# Patient Record
Sex: Female | Born: 1970 | Race: White | Hispanic: No | Marital: Married | Smoking: Never smoker
Health system: Southern US, Community
[De-identification: ages and names within clinical notes are randomized; demographics above are authoritative.]

---

## 1999-07-31 ENCOUNTER — Other Ambulatory Visit: Admission: RE | Admit: 1999-07-31 | Discharge: 1999-07-31 | Payer: Self-pay | Admitting: Obstetrics and Gynecology

## 2000-09-09 ENCOUNTER — Other Ambulatory Visit: Admission: RE | Admit: 2000-09-09 | Discharge: 2000-09-09 | Payer: Self-pay | Admitting: Obstetrics and Gynecology

## 2001-08-06 ENCOUNTER — Other Ambulatory Visit: Admission: RE | Admit: 2001-08-06 | Discharge: 2001-08-06 | Payer: Self-pay | Admitting: Obstetrics and Gynecology

## 2001-09-23 ENCOUNTER — Inpatient Hospital Stay (HOSPITAL_COMMUNITY): Admission: AD | Admit: 2001-09-23 | Discharge: 2001-09-25 | Payer: Self-pay | Admitting: Obstetrics and Gynecology

## 2001-09-23 ENCOUNTER — Encounter: Admission: RE | Admit: 2001-09-23 | Discharge: 2001-12-22 | Payer: Self-pay | Admitting: Obstetrics and Gynecology

## 2002-02-26 ENCOUNTER — Encounter: Payer: Self-pay | Admitting: Obstetrics and Gynecology

## 2002-02-26 ENCOUNTER — Ambulatory Visit (HOSPITAL_COMMUNITY): Admission: RE | Admit: 2002-02-26 | Discharge: 2002-02-26 | Payer: Self-pay | Admitting: Obstetrics and Gynecology

## 2002-03-05 ENCOUNTER — Inpatient Hospital Stay (HOSPITAL_COMMUNITY): Admission: AD | Admit: 2002-03-05 | Discharge: 2002-03-14 | Payer: Self-pay | Admitting: Obstetrics and Gynecology

## 2002-03-06 ENCOUNTER — Encounter: Payer: Self-pay | Admitting: Obstetrics & Gynecology

## 2002-03-07 ENCOUNTER — Encounter: Payer: Self-pay | Admitting: *Deleted

## 2002-03-09 ENCOUNTER — Encounter: Payer: Self-pay | Admitting: Obstetrics & Gynecology

## 2002-03-11 ENCOUNTER — Encounter: Payer: Self-pay | Admitting: Obstetrics and Gynecology

## 2002-03-14 ENCOUNTER — Encounter: Payer: Self-pay | Admitting: Obstetrics and Gynecology

## 2002-04-05 ENCOUNTER — Inpatient Hospital Stay (HOSPITAL_COMMUNITY): Admission: AD | Admit: 2002-04-05 | Discharge: 2002-04-08 | Payer: Self-pay | Admitting: Obstetrics & Gynecology

## 2002-05-21 ENCOUNTER — Other Ambulatory Visit: Admission: RE | Admit: 2002-05-21 | Discharge: 2002-05-21 | Payer: Self-pay | Admitting: Obstetrics and Gynecology

## 2003-08-08 ENCOUNTER — Other Ambulatory Visit: Admission: RE | Admit: 2003-08-08 | Discharge: 2003-08-08 | Payer: Self-pay | Admitting: Obstetrics and Gynecology

## 2004-03-01 ENCOUNTER — Inpatient Hospital Stay (HOSPITAL_COMMUNITY): Admission: AD | Admit: 2004-03-01 | Discharge: 2004-03-01 | Payer: Self-pay | Admitting: *Deleted

## 2004-03-10 ENCOUNTER — Inpatient Hospital Stay (HOSPITAL_COMMUNITY): Admission: AD | Admit: 2004-03-10 | Discharge: 2004-03-10 | Payer: Self-pay | Admitting: Obstetrics and Gynecology

## 2004-03-13 ENCOUNTER — Ambulatory Visit (HOSPITAL_COMMUNITY): Admission: RE | Admit: 2004-03-13 | Discharge: 2004-03-13 | Payer: Self-pay | Admitting: Obstetrics and Gynecology

## 2004-07-26 ENCOUNTER — Ambulatory Visit (HOSPITAL_COMMUNITY): Admission: RE | Admit: 2004-07-26 | Discharge: 2004-07-26 | Payer: Self-pay | Admitting: Obstetrics and Gynecology

## 2004-12-12 IMAGING — US US OB COMP LESS 14 WK
1 series · 8 of 8 positions shown · non-contrast
Comparison: none

CLINICAL DATA: Possible ectopic pregnancy.  Non-rising quantitative beta hCG with the highest beta hCG recorded so far of 300.
EARLY OBSTETRICAL ULTRASOUND WITH TRANSVAGINAL, 03/01/04
Multiple images of the uterus and adnexa were obtained using a transabdominal and endovaginal approaches.  
There is an irregular intrauterine fluid collection identified which when measured would correlate with a 5 week 0 day gestation.  There does appear to be a decidual reaction around this.  No evidence for yolk sac or fetal pole could be seen but would not be expected if this were a gestational sac as the mean sac diameter would be 10 mm.  Some questionable hydropic change is evident within the suspected chorionic reaction.  
Both ovaries are seen and have a normal appearance, with the right ovary measuring 3.0 x 1.6 x 1.5 cm and the left ovary measuring 2.3 x. 3.1 x 1.7 cm.  No sonographic signs of an ectopic pregnancy are seen but may not be visualized with a quantitative beta hCG of 300.  The intrauterine findings could represent an irregular gestational sac with hydropic changes representing stigmata of a failed intrauterine pregnancy.  However, a pseudo gestational sac cannot be excluded with this appearance.
IMPRESSION 
Intrauterine fluid collection felt most likely to represent an irregular gestational sac with associated early hydropic changes and adjacent decidual reaction.  This suggests a failed gestation  in light of the patient?s history of a non-rising quantitative beta hCG.  A pseudo gestational sac with sonographically silent ectopic pregnancy would be a secondary consideration.
Normal ovaries.  No evidence for free intraperitoneal fluid.

[Series 1: us ob comp less 14 wk · 8 of 8 slices shown]
[im 1/8]
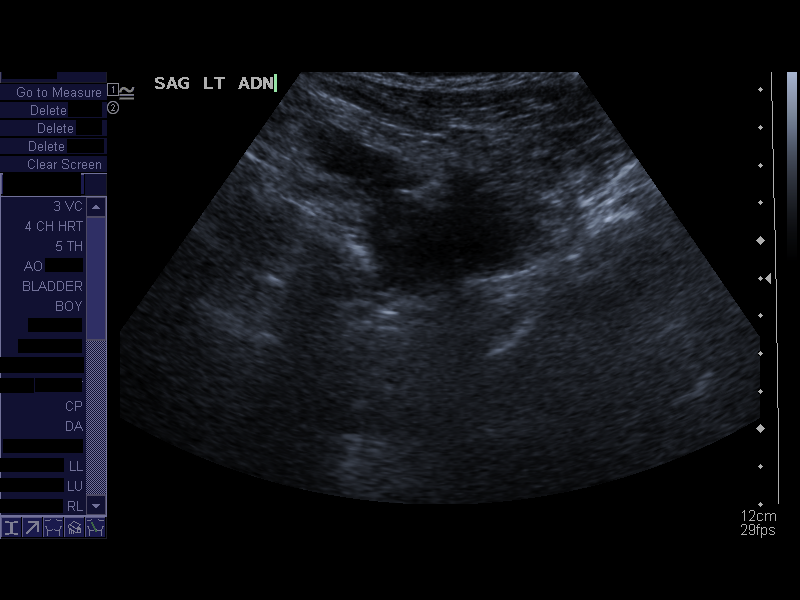
[im 2/8]
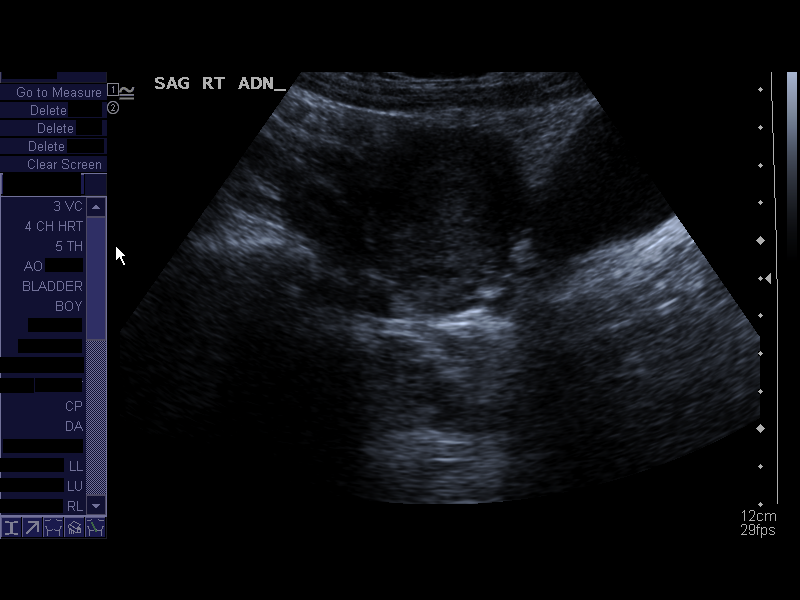
[im 3/8]
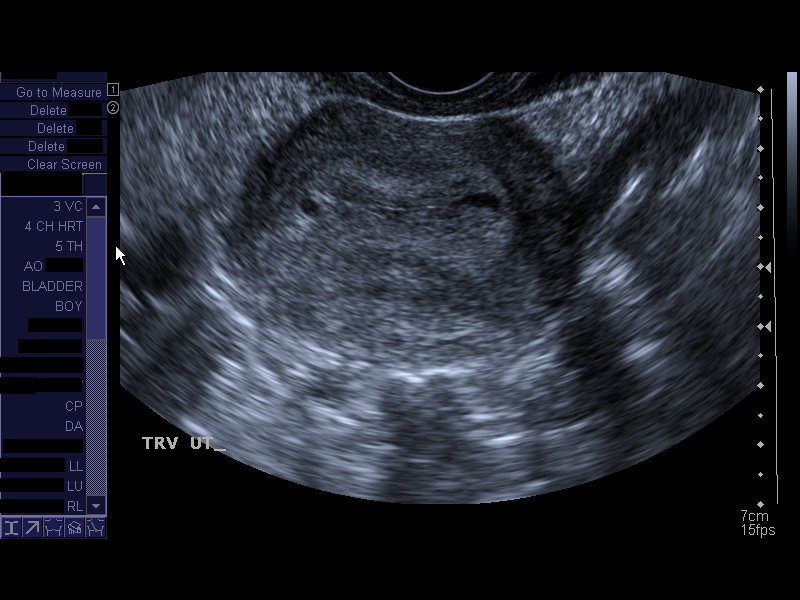
[im 4/8]
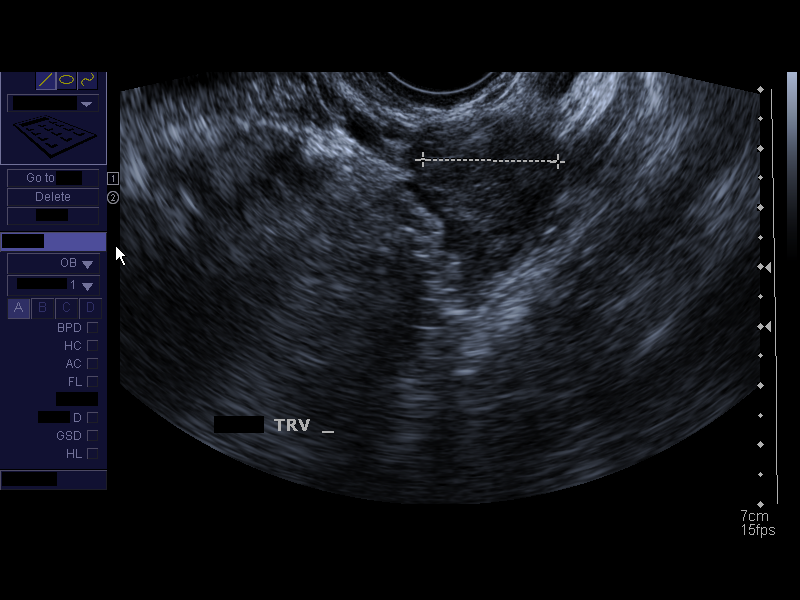
[im 5/8]
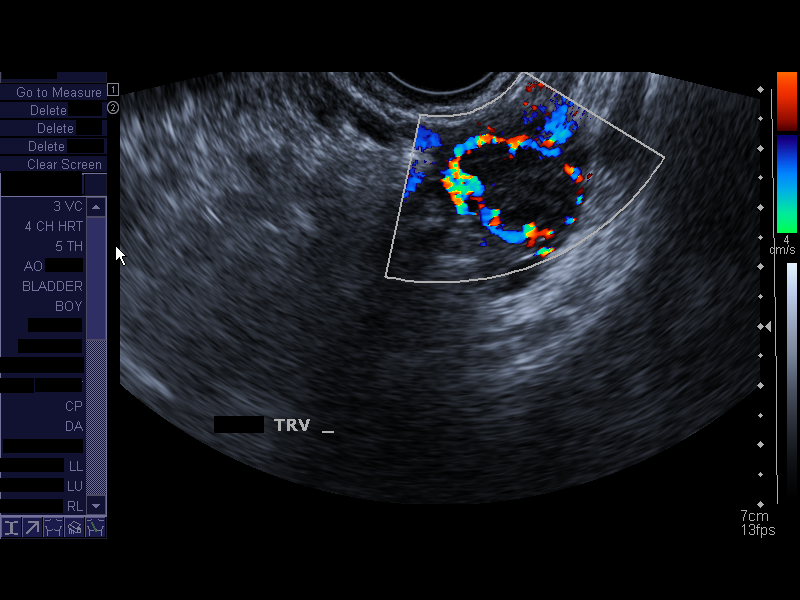
[im 6/8]
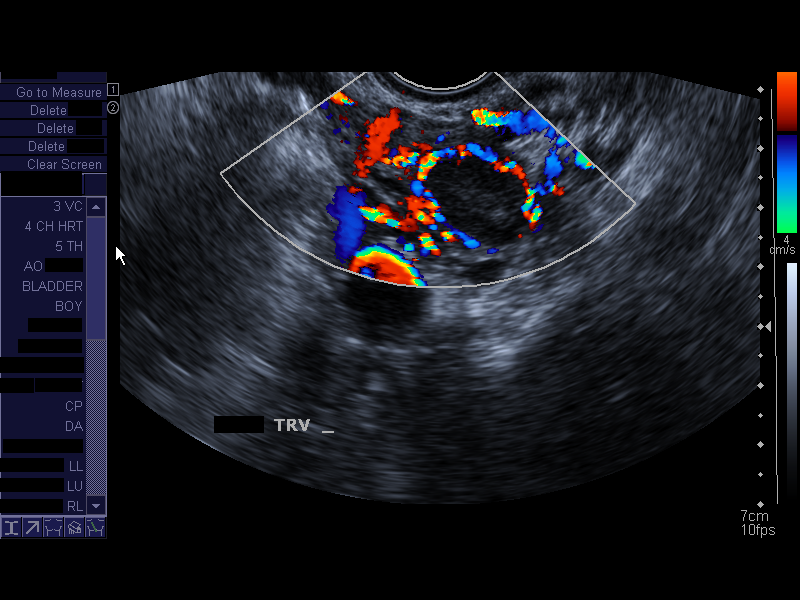
[im 7/8]
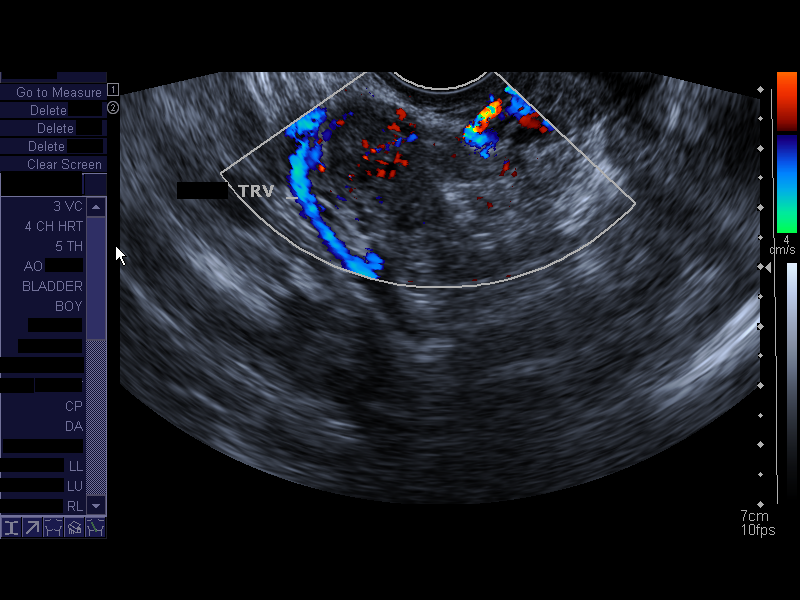
[im 8/8]
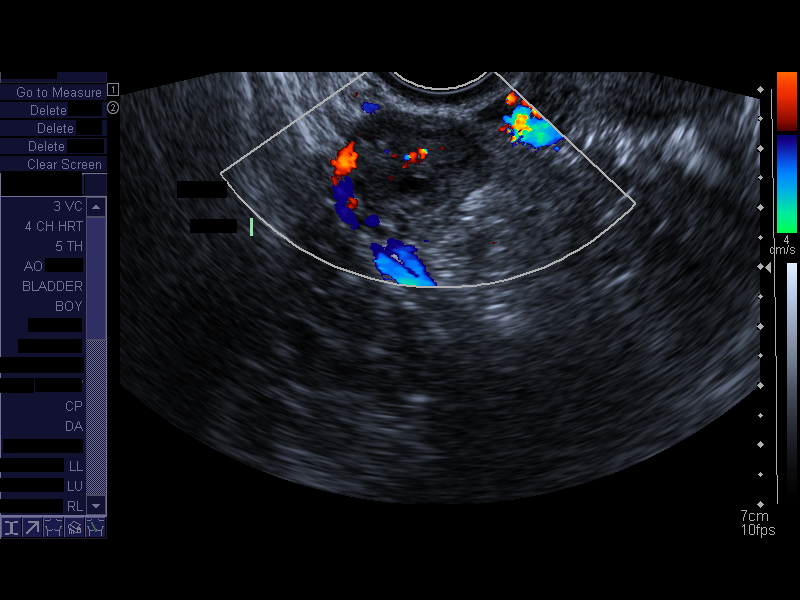

[8 of 8 positions shown; findings below may reference images not displayed]

## 2005-03-11 ENCOUNTER — Inpatient Hospital Stay (HOSPITAL_COMMUNITY): Admission: RE | Admit: 2005-03-11 | Discharge: 2005-03-11 | Payer: Self-pay | Admitting: Obstetrics and Gynecology

## 2005-03-12 ENCOUNTER — Inpatient Hospital Stay (HOSPITAL_COMMUNITY): Admission: AD | Admit: 2005-03-12 | Discharge: 2005-03-14 | Payer: Self-pay | Admitting: Obstetrics and Gynecology

## 2005-05-08 IMAGING — US US OB TRANSVAGINAL MODIFY
1 series · 13 of 28 positions shown · non-contrast
Comparison: none

CLINICAL DATA: Assess for viability.  
EARLY OBSTETRICAL ULTRASOUND WITH TRANSVAGINAL:
Multiple images of the uterus and adnexa were obtained using a transabdominal and endovaginal approaches. 
There is an intrauterine gestational sac identified which has sac size correlating with a 6 week 3 day gestation.  A mean sac diameter of 16 mm is noted.  No definite fetal pole or cardiac flicker is identified on today?s exam, but would not necessarily be expected at today?s mean sac diameter.  Follow-up evaluation in greater than 4 days can be undertaken for further assessment as we would expect to see a fetal pole by the time the gestational sac mean diameter reaches 18 mm.  No evidence for subchorionic hemorrhage is seen.
Both ovaries are visualized with the right ovary measuring 2.4 x 1.3 x 2.0 cm and the left ovary measuring 2.9 x 1.9 x 2.0 cm and containing a corpus luteum cyst.  No cul-de-sac or periovarian fluid is seen and no separate adnexal masses are noted.

[Series 1: us ob transvaginal modify · 0.29mm/px · 13 of 37 slices shown]
[im 2/37]
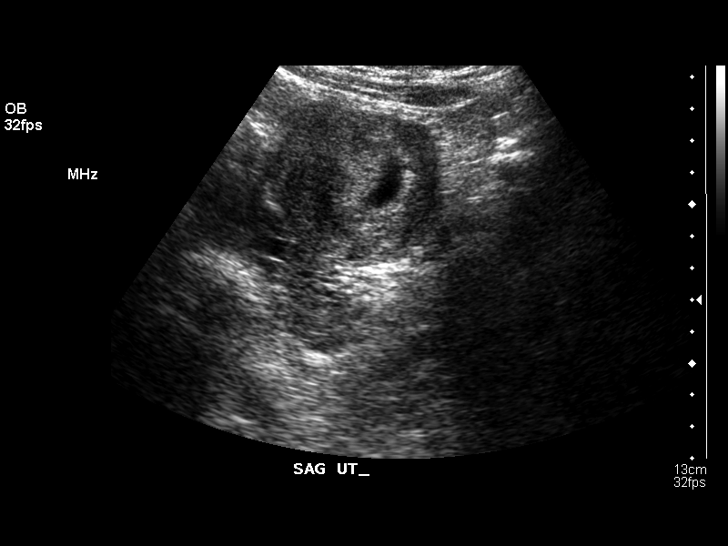
[im 5/37]
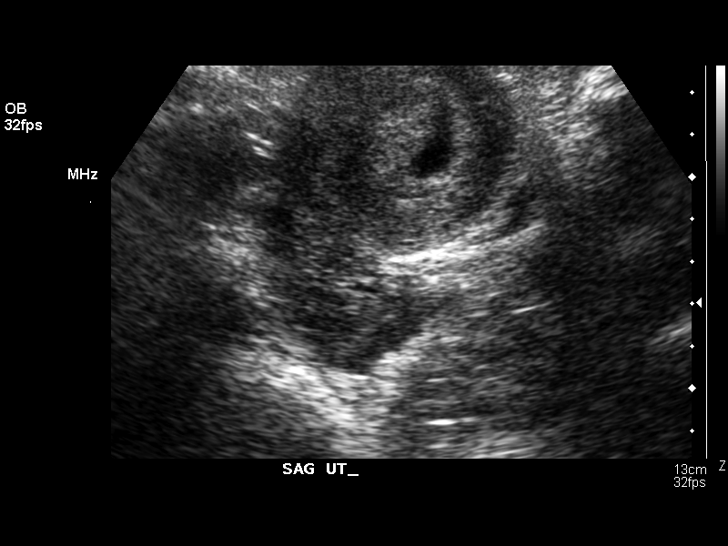
[im 7/37]
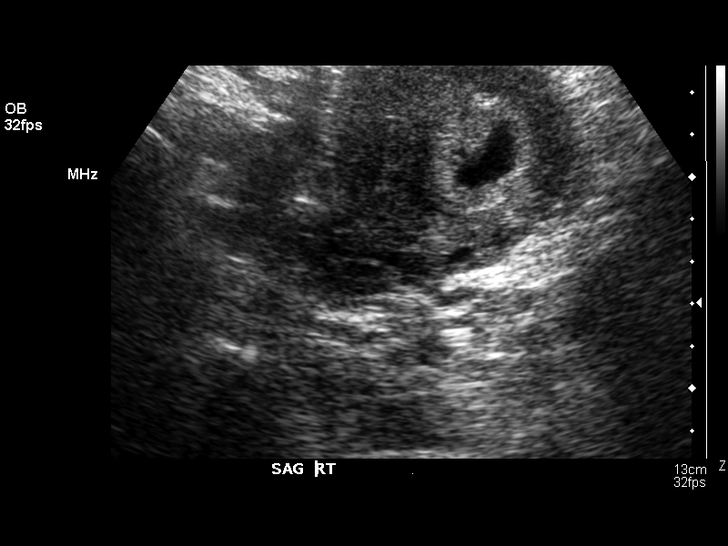
[im 10/37]
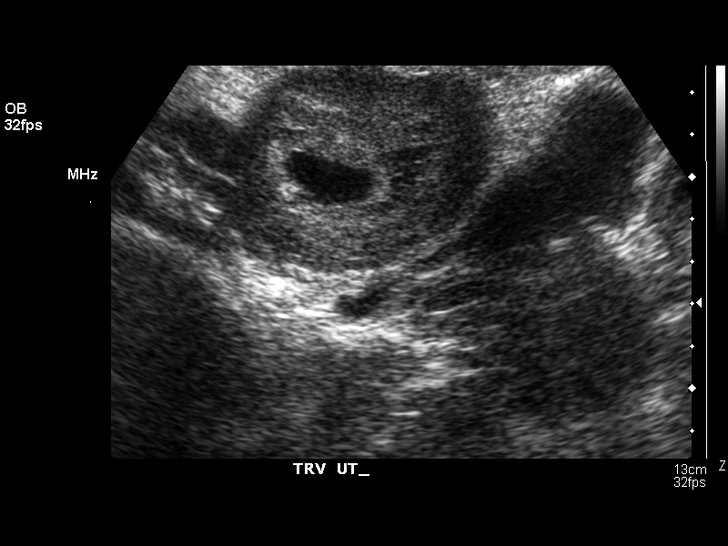
[im 13/37]
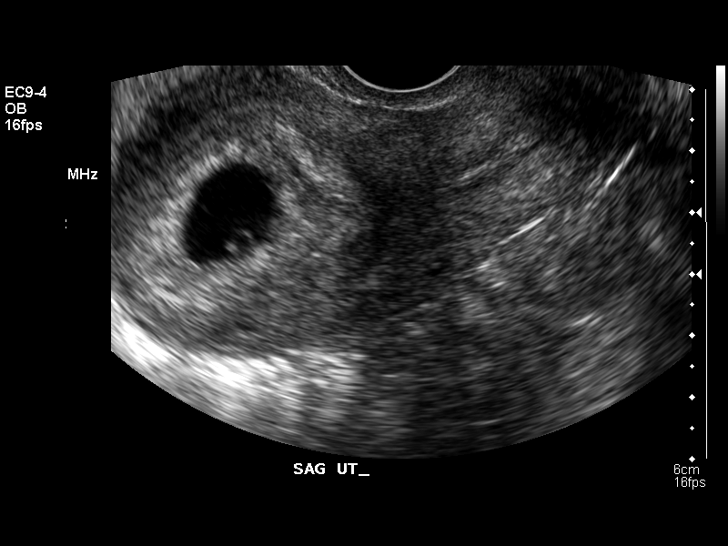
[im 15/37]
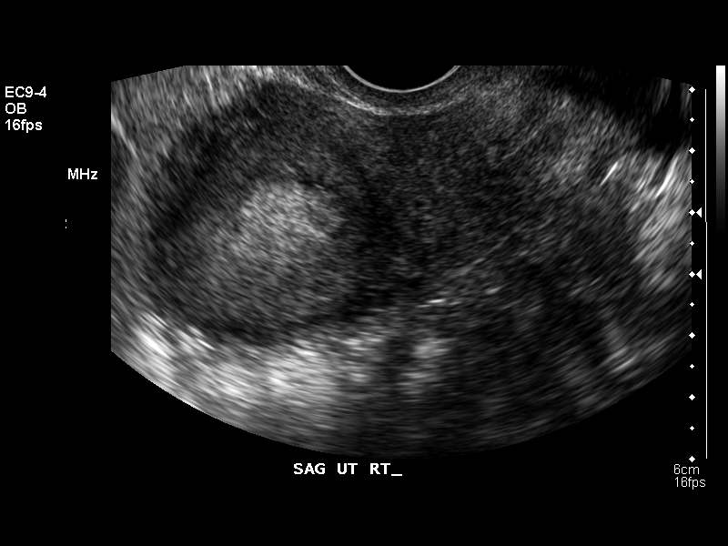
[im 19/37]
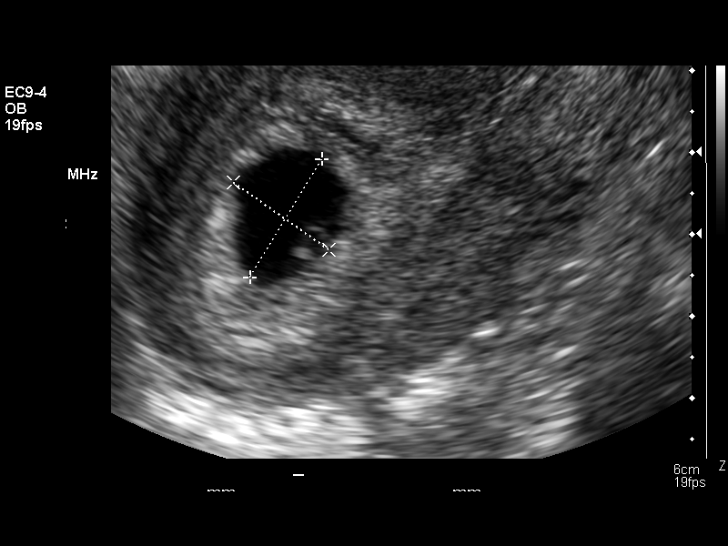
[im 22/37]
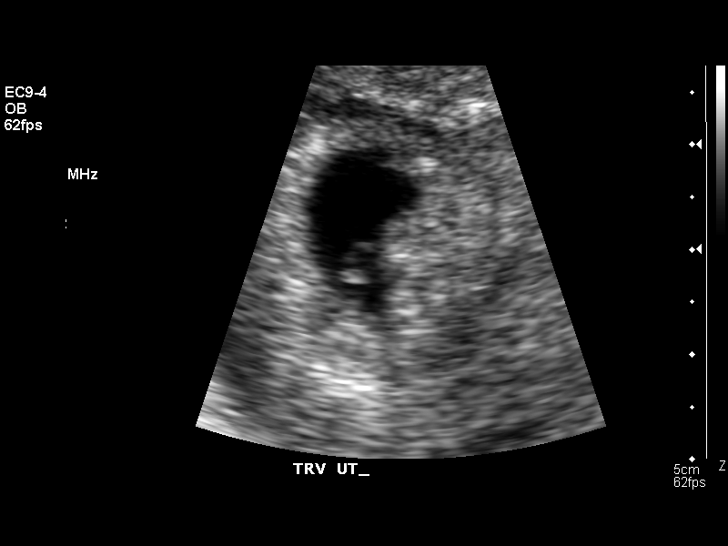
[im 25/37]
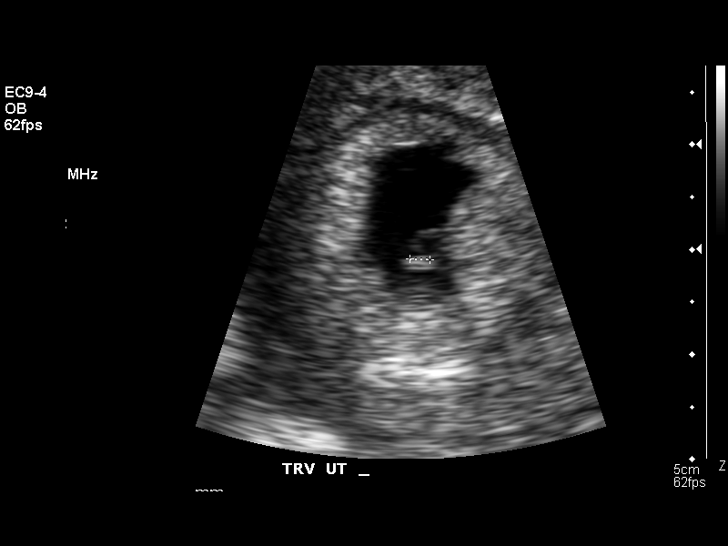
[im 27/37]
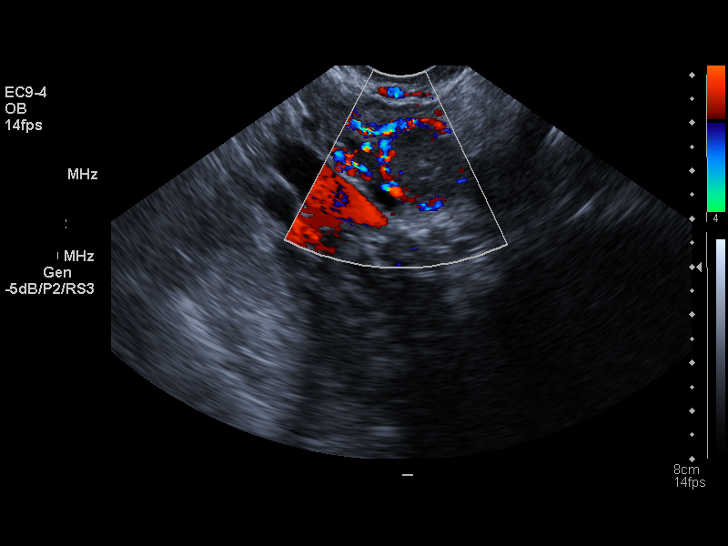
[im 30/37]
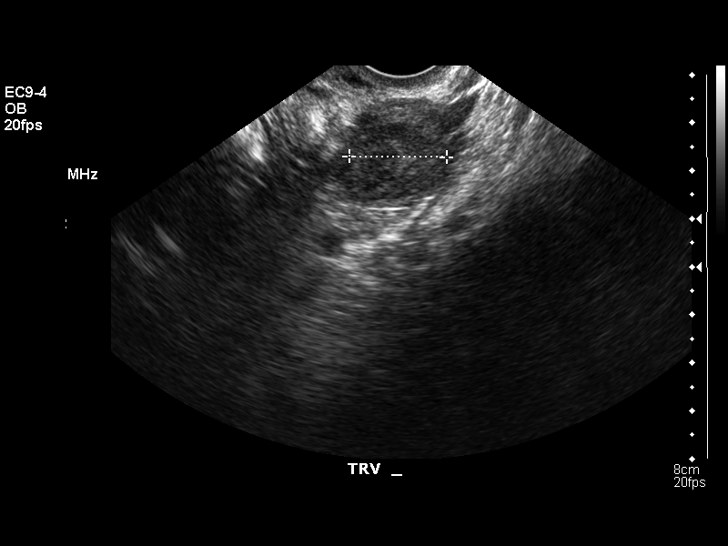
[im 33/37]
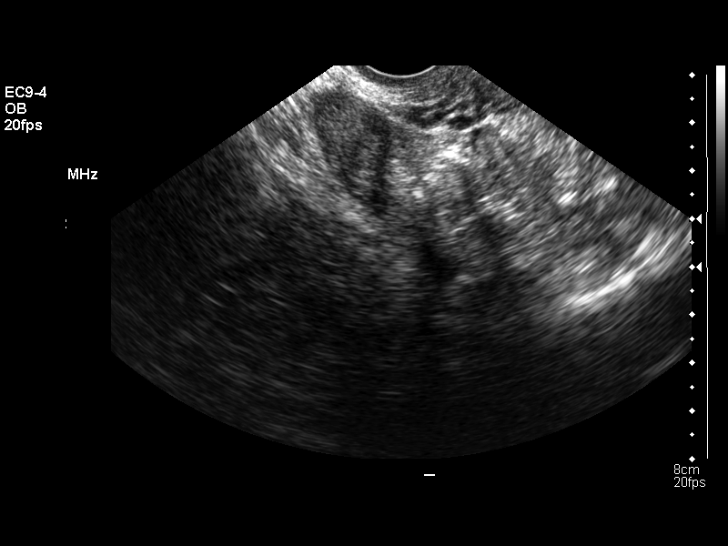
[im 35/37]
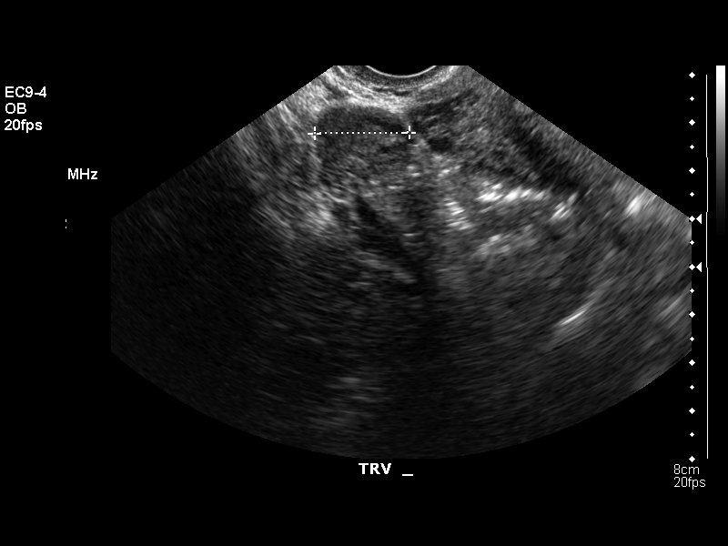

[13 of 28 positions shown; findings below may reference images not displayed]

IMPRESSION: 6 week 3 day intrauterine gestational sac with yolk sac.   Follow-up evaluation in one week can be undertaken to assess for progression of gestation to visualization of fetal pole and fetal cardiac activity.  Normal ovaries.

## 2010-08-19 ENCOUNTER — Encounter: Payer: Self-pay | Admitting: Obstetrics and Gynecology

## 2012-06-08 ENCOUNTER — Other Ambulatory Visit: Payer: Self-pay | Admitting: Obstetrics and Gynecology

## 2012-06-08 DIAGNOSIS — Z1231 Encounter for screening mammogram for malignant neoplasm of breast: Secondary | ICD-10-CM

## 2012-07-31 ENCOUNTER — Ambulatory Visit
Admission: RE | Admit: 2012-07-31 | Discharge: 2012-07-31 | Disposition: A | Discharge status: Home / Self Care | Payer: BC Managed Care – PPO | Source: Ambulatory Visit | Attending: Obstetrics and Gynecology | Admitting: Obstetrics and Gynecology

## 2012-07-31 DIAGNOSIS — Z1231 Encounter for screening mammogram for malignant neoplasm of breast: Secondary | ICD-10-CM

## 2013-01-27 ENCOUNTER — Other Ambulatory Visit: Payer: Self-pay | Admitting: *Deleted

## 2013-01-27 MED ORDER — GLUCOSE BLOOD VI STRP: ORAL_STRIP | Status: DC

## 2013-02-18 ENCOUNTER — Other Ambulatory Visit: Payer: Self-pay | Admitting: *Deleted

## 2013-02-18 MED ORDER — INSULIN ASPART 100 UNIT/ML ~~LOC~~ SOLN: SUBCUTANEOUS | Status: DC

## 2013-03-24 ENCOUNTER — Other Ambulatory Visit: Payer: Self-pay | Admitting: *Deleted

## 2013-03-27 ENCOUNTER — Other Ambulatory Visit: Payer: Self-pay | Admitting: Endocrinology

## 2013-04-13 ENCOUNTER — Other Ambulatory Visit: Payer: Self-pay | Admitting: *Deleted

## 2013-04-13 ENCOUNTER — Other Ambulatory Visit: Payer: Self-pay | Admitting: Endocrinology

## 2013-04-13 DIAGNOSIS — E119 Type 2 diabetes mellitus without complications: Secondary | ICD-10-CM

## 2013-04-13 DIAGNOSIS — E785 Hyperlipidemia, unspecified: Secondary | ICD-10-CM | POA: Insufficient documentation

## 2013-04-13 DIAGNOSIS — E1065 Type 1 diabetes mellitus with hyperglycemia: Secondary | ICD-10-CM | POA: Insufficient documentation

## 2013-04-22 ENCOUNTER — Ambulatory Visit (INDEPENDENT_AMBULATORY_CARE_PROVIDER_SITE_OTHER): Payer: BC Managed Care – PPO | Admitting: Endocrinology

## 2013-04-22 ENCOUNTER — Other Ambulatory Visit (INDEPENDENT_AMBULATORY_CARE_PROVIDER_SITE_OTHER): Payer: BC Managed Care – PPO

## 2013-04-22 ENCOUNTER — Encounter: Payer: Self-pay | Admitting: Endocrinology

## 2013-04-22 VITALS — BP 120/82 | HR 79 | Temp 98.3°F | Resp 12 | Ht 63.0 in | Wt 178.1 lb

## 2013-04-22 DIAGNOSIS — E7800 Pure hypercholesterolemia, unspecified: Secondary | ICD-10-CM

## 2013-04-22 DIAGNOSIS — E119 Type 2 diabetes mellitus without complications: Secondary | ICD-10-CM

## 2013-04-22 DIAGNOSIS — E785 Hyperlipidemia, unspecified: Secondary | ICD-10-CM

## 2013-04-22 DIAGNOSIS — E1065 Type 1 diabetes mellitus with hyperglycemia: Secondary | ICD-10-CM

## 2013-04-22 LAB — URINALYSIS, ROUTINE W REFLEX MICROSCOPIC
Bilirubin Urine: NEGATIVE
Ketones, ur: NEGATIVE
Nitrite: NEGATIVE
Specific Gravity, Urine: 1.02 (ref 1.000–1.030)
Total Protein, Urine: NEGATIVE
Urobilinogen, UA: 0.2 (ref 0.0–1.0)
pH: 5.5 (ref 5.0–8.0)

## 2013-04-22 LAB — LIPID PANEL
Cholesterol: 230 mg/dL — ABNORMAL HIGH (ref 0–200)
HDL: 66.9 mg/dL (ref >39.00)
Triglycerides: 36 mg/dL (ref 0.0–149.0)
VLDL: 7.2 mg/dL (ref 0.0–40.0)

## 2013-04-22 LAB — COMPREHENSIVE METABOLIC PANEL
AST: 12 U/L (ref 0–37)
Albumin: 4 g/dL (ref 3.5–5.2)
Alkaline Phosphatase: 47 U/L (ref 39–117)
BUN: 16 mg/dL (ref 6–23)
CO2: 27 mEq/L (ref 19–32)
Calcium: 8.9 mg/dL (ref 8.4–10.5)
Chloride: 106 mEq/L (ref 96–112)
GFR: 78.98 mL/min (ref >60.00)
Glucose, Bld: 151 mg/dL — ABNORMAL HIGH (ref 70–99)
Potassium: 4.3 mEq/L (ref 3.5–5.1)
Total Protein: 6.7 g/dL (ref 6.0–8.3)

## 2013-04-22 LAB — MICROALBUMIN / CREATININE URINE RATIO: Creatinine,U: 79.8 mg/dL

## 2013-04-22 LAB — HEMOGLOBIN A1C: Hgb A1c MFr Bld: 5.5 % (ref 4.6–6.5)

## 2013-04-22 NOTE — Progress Notes (Signed)
Patient ID: UNNAMED Tammy Vega, female   DOB: Nov 28, 1970, 42 y.o.   MRN: [id number]  Tammy Vega is an 42 y.o. female.   Reason for Appointment: Insulin Pump followup:   History of Present Illness   Diagnosis: Type 1 DIABETES MELITUS, date of diagnosis:2003     DIABETES history: She was initially diagnosed with diabetes during pregnancy and required insulin. She went on insulin again in 2004 and was started on the pump in 2005. Previously has had somewhat very well controlled with needing periodic adjustments and A1c around 6-7  CURRENT insulin pump:  Medtronic  HISTORY: She has not been seen since 1/14. Her blood sugars are overall high with average of 181. Most of her high readings are on waking up and late in the evening and she has a tendency to hypoglycemia before supper. Not taking many readings during the day Diet: Usually eating a protein bar at breakfast and is arbitrarily bolusing 2-3 units for that. Blood sugars are variable at lunch and training to be higher after supper. Some of these high readings may be related to missing boluses at suppertime but also having tend to higher readings if eating late evening meal She had previously been on a higher basal rate for her premenstrual week but is not using this now  The pump SETTINGS are: Basal rate: 0.525 at midnight, 3 AM = 0.65., 5 AM = 1.40, 7:30 a.m. = 1.0,. 11 AM = 1.40, 4 PM = 0.80, 7 PM = 1.20 and 0.8 at 11 p.m. Carbohydrate ratio 1:15 at breakfast, 1:12  other meals . Correction factor 1: 40, target 90-120  GLUCOSE CONTROL with the pump is assessed today by pump download.  Fasting readings recently 155-256. Midday 96-257 and around 5-7 PM 48-266, late evening/at bedtime 93-360, average reading there are bedtime 243 and in the morning 195  HYPOGLYCEMIC episodes are mostly between 5-7 PM, occasionally after a bolus, rarely at midnight or 10 AM  Lab Results  Component Value Date   HGBA1C 5.5 04/22/2013    EXERCISE:  Not consistent  MICROALBUMIN has been tested, and the result is normal .  Appointment on 04/22/2013  Component Date Value Range Status  . Hemoglobin A1C 04/22/2013 5.5  4.6 - 6.5 % Final   Glycemic Control Guidelines for People with Diabetes:Non Diabetic:  <6%Goal of Therapy: <7%Additional Action Suggested:  >8%   . Sodium 04/22/2013 138  135 - 145 mEq/L Final  . Potassium 04/22/2013 4.3  3.5 - 5.1 mEq/L Final  . Chloride 04/22/2013 106  96 - 112 mEq/L Final  . CO2 04/22/2013 27  19 - 32 mEq/L Final  . Glucose, Bld 04/22/2013 151* 70 - 99 mg/dL Final  . BUN 78/29/5621 16  6 - 23 mg/dL Final  . Creatinine, Ser 04/22/2013 0.8  0.4 - 1.2 mg/dL Final  . Total Bilirubin 04/22/2013 0.8  0.3 - 1.2 mg/dL Final  . Alkaline Phosphatase 04/22/2013 47  39 - 117 U/L Final  . AST 04/22/2013 12  0 - 37 U/L Final  . ALT 04/22/2013 10  0 - 35 U/L Final  . Total Protein 04/22/2013 6.7  6.0 - 8.3 g/dL Final  . Albumin 30/86/5784 4.0  3.5 - 5.2 g/dL Final  . Calcium 69/62/9528 8.9  8.4 - 10.5 mg/dL Final  . GFR 41/32/4401 78.98  >60.00 mL/min Final  . Microalb, Ur 04/22/2013 1.4  0.0 - 1.9 mg/dL Final  . Creatinine,U 02/72/5366 79.8   Final  . Microalb Creat Ratio  04/22/2013 1.8  0.0 - 30.0 mg/g Final  . Cholesterol 04/22/2013 230* 0 - 200 mg/dL Final   ATP III Classification       Desirable:  < 200 mg/dL               Borderline High:  200 - 239 mg/dL          High:  > = 161 mg/dL  . Triglycerides 04/22/2013 36.0  0.0 - 149.0 mg/dL Final   Normal:  <096 mg/dLBorderline High:  150 - 199 mg/dL  . HDL 04/22/2013 66.90  >39.00 mg/dL Final  . VLDL 04/54/0981 7.2  0.0 - 19.1 mg/dL Final  . Total CHOL/HDL Ratio 04/22/2013 3   Final                  Men          Women1/2 Average Risk     3.4          3.3Average Risk          5.0          4.42X Average Risk          9.6          7.13X Average Risk          15.0          11.0                      . Color, Urine 04/22/2013 LT. YELLOW  Yellow;Lt. Yellow Final  .  APPearance 04/22/2013 CLEAR  Clear Final  . Specific Gravity, Urine 04/22/2013 1.020  1.000-1.030 Final  . pH 04/22/2013 5.5  5.0 - 8.0 Final  . Total Protein, Urine 04/22/2013 NEGATIVE  Negative Final  . Urine Glucose 04/22/2013 NEGATIVE  Negative Final  . Ketones, ur 04/22/2013 NEGATIVE  Negative Final  . Bilirubin Urine 04/22/2013 NEGATIVE  Negative Final  . Hgb urine dipstick 04/22/2013 SMALL  Negative Final  . Urobilinogen, UA 04/22/2013 0.2  0.0 - 1.0 Final  . Leukocytes, UA 04/22/2013 NEGATIVE  Negative Final  . Nitrite 04/22/2013 NEGATIVE  Negative Final  . WBC, UA 04/22/2013 0-2/hpf  0-2/hpf Final  . RBC / HPF 04/22/2013 0-2/hpf  0-2/hpf Final  . Squamous Epithelial / LPF 04/22/2013 Rare(0-4/hpf)  Rare(0-4/hpf) Final  . Direct LDL 04/22/2013 150.5   Final   Optimal:  <100 mg/dLNear or Above Optimal:  100-129 mg/dLBorderline High:  130-159 mg/dLHigh:  160-189 mg/dLVery High:  >190 mg/dL      Medication List       This list is accurate as of: 04/22/13  4:08 PM.  Always use your most recent med list.               glucose blood test strip  Commonly known as:  BAYER CONTOUR NEXT TEST  Test blood sugars 8 times daily.     ibuprofen 200 MG tablet  Commonly known as:  ADVIL,MOTRIN  Take 200 mg by mouth every 6 (six) hours as needed for pain (PRN).     levonorgestrel 20 MCG/24HR IUD  Commonly known as:  MIRENA  1 each by Intrauterine route once.     NOVOLOG 100 UNIT/ML injection  Generic drug:  insulin aspart  use max of 60 units per day with pump as directed.     simvastatin 40 MG tablet  Commonly known as:  ZOCOR  40 mg.        Allergies: No Known Allergies  No past medical history on file.  No past surgical history on file.  No family history on file.  Social History:  reports that she has never smoked. She has never used smokeless tobacco. Her alcohol and drug histories are not on file.  REVIEW of systems:  She has had hypercholesterolemia and has  been on Zocor for a few years. LDL needs to be assessed  EXAM:  BP 120/82  Pulse 79  Temp(Src) 98.3 F (36.8 C)  Resp 12  Ht 5\' 3"  (1.6 m)  Wt 178 lb 1.6 oz (80.786 kg)  BMI 31.56 kg/m2  SpO2 96%  ASSESSMENT:  Diabetes with fair control, recently appears to be having significant hyperglycemia especially on waking up and late evening  Problems identified/plan:   Needing higher basal rate on waking up and probably late in the evening Needs to check more readings at lunchtime Also needs to periodically monitor postprandial readings to help adjust carbohydrate ratio Reduced basal rate needed at suppertime to prevent hyperglycemia She will try to enter carbohydrates for her meals more consistently as her boluses only 37% with food Avoid overriding boluses which he is doing 60% of the time  New basal rates:Basal rate: 0.525 at midnight, 3 AM = 0.65., 5 AM = 1.50, 7:30 a.m. = 1.0,. 11 AM = 1.40, 4 PM = 0.70, 8 PM = 1.3 and 0.8 at 11 p.m.   Hypercholesterolemia: Labs to be checked  Counseling time over 50% of today's 25 minute visit  Makaylie Dedeaux 04/22/2013, 4:08 PM

## 2013-04-22 NOTE — Patient Instructions (Addendum)
NEW BASAL  CHECK 2 HR AFTER MEALS

## 2013-04-23 ENCOUNTER — Ambulatory Visit: Payer: BC Managed Care – PPO

## 2013-04-23 ENCOUNTER — Telehealth: Payer: Self-pay | Admitting: *Deleted

## 2013-04-23 DIAGNOSIS — IMO0002 Reserved for concepts with insufficient information to code with codable children: Secondary | ICD-10-CM

## 2013-04-23 DIAGNOSIS — R5381 Other malaise: Secondary | ICD-10-CM

## 2013-04-23 DIAGNOSIS — R5383 Other fatigue: Secondary | ICD-10-CM

## 2013-04-23 DIAGNOSIS — E1065 Type 1 diabetes mellitus with hyperglycemia: Secondary | ICD-10-CM

## 2013-04-23 LAB — FRUCTOSAMINE: Fructosamine: 315 umol/L — ABNORMAL HIGH (ref <285)

## 2013-04-23 LAB — CBC WITH DIFFERENTIAL/PLATELET
Basophils Absolute: 0 10*3/uL (ref 0.0–0.1)
Basophils Relative: 0.4 % (ref 0.0–3.0)
Eosinophils Absolute: 0.1 10*3/uL (ref 0.0–0.7)
Eosinophils Relative: 1.8 % (ref 0.0–5.0)
HCT: 43.5 % (ref 36.0–46.0)
Hemoglobin: 14.6 g/dL (ref 12.0–15.0)
Lymphocytes Relative: 29.8 % (ref 12.0–46.0)
Lymphs Abs: 1.6 10*3/uL (ref 0.7–4.0)
MCHC: 33.5 g/dL (ref 30.0–36.0)
MCV: 90.2 fl (ref 78.0–100.0)
Monocytes Absolute: 0.5 10*3/uL (ref 0.1–1.0)
Monocytes Relative: 9.1 % (ref 3.0–12.0)
Neutro Abs: 3.2 10*3/uL (ref 1.4–7.7)
Neutrophils Relative %: 58.9 % (ref 43.0–77.0)
Platelets: 308 10*3/uL (ref 150.0–400.0)
RBC: 4.83 Mil/uL (ref 3.87–5.11)
RDW: 13.5 % (ref 11.5–14.6)
WBC: 5.4 10*3/uL (ref 4.5–10.5)

## 2013-04-23 NOTE — Telephone Encounter (Signed)
Message copied by Hermenia Bers on Fri Apr 23, 2013 11:59 AM ------      Message from: Reather Littler      Created: Fri Apr 23, 2013 10:40 AM       A1c is 5.5, not expected, test was repeated. Also checking fructosamine which gives [place]  short-term average      Cholesterol is high, is she taking simvastatin? ------

## 2013-04-23 NOTE — Telephone Encounter (Signed)
Left message to return call 

## 2013-04-26 ENCOUNTER — Telehealth: Payer: Self-pay | Admitting: *Deleted

## 2013-04-26 NOTE — Telephone Encounter (Signed)
Left message to return call 

## 2013-04-26 NOTE — Telephone Encounter (Signed)
Message copied by Hermenia Bers on Mon Apr 26, 2013  3:57 PM ------      Message from: Reather Littler      Created: Fri Apr 23, 2013 10:40 AM       A1c is 5.5, not expected, test was repeated. Also checking fructosamine which gives [place]  short-term average      Cholesterol is high, is she taking simvastatin? ------

## 2013-04-27 ENCOUNTER — Telehealth: Payer: Self-pay | Admitting: *Deleted

## 2013-04-27 NOTE — Telephone Encounter (Signed)
Message copied by Hermenia Bers on Tue Apr 27, 2013  3:51 PM ------      Message from: Reather Littler      Created: Fri Apr 23, 2013 10:40 AM       A1c is 5.5, not expected, test was repeated. Also checking fructosamine which gives [place]  short-term average      Cholesterol is high, is she taking simvastatin? ------

## 2013-04-27 NOTE — Telephone Encounter (Signed)
Left message to return call 

## 2013-04-28 ENCOUNTER — Encounter: Payer: Self-pay | Admitting: *Deleted

## 2013-04-29 ENCOUNTER — Telehealth: Payer: Self-pay | Admitting: *Deleted

## 2013-04-29 NOTE — Telephone Encounter (Signed)
Left message to return call 

## 2013-04-29 NOTE — Telephone Encounter (Signed)
Message copied by Hermenia Bers on Thu Apr 29, 2013  4:38 PM ------      Message from: Reather Littler      Created: Fri Apr 23, 2013 10:40 AM       A1c is 5.5, not expected, test was repeated. Also checking fructosamine which gives [place]  short-term average      Cholesterol is high, is she taking simvastatin? ------

## 2013-05-03 ENCOUNTER — Telehealth: Payer: Self-pay | Admitting: *Deleted

## 2013-05-03 NOTE — Telephone Encounter (Signed)
Left message to return call 

## 2013-05-04 ENCOUNTER — Encounter: Payer: Self-pay | Admitting: *Deleted

## 2013-05-08 ENCOUNTER — Other Ambulatory Visit: Payer: Self-pay | Admitting: Endocrinology

## 2013-07-30 ENCOUNTER — Other Ambulatory Visit: Payer: BC Managed Care – PPO

## 2013-08-09 ENCOUNTER — Ambulatory Visit: Payer: BC Managed Care – PPO | Admitting: Endocrinology

## 2013-08-20 ENCOUNTER — Other Ambulatory Visit: Payer: BC Managed Care – PPO

## 2013-08-23 ENCOUNTER — Ambulatory Visit: Payer: BC Managed Care – PPO | Admitting: Endocrinology

## 2013-10-28 ENCOUNTER — Other Ambulatory Visit: Payer: BC Managed Care – PPO

## 2013-10-28 DIAGNOSIS — E1065 Type 1 diabetes mellitus with hyperglycemia: Secondary | ICD-10-CM

## 2013-10-28 DIAGNOSIS — IMO0002 Reserved for concepts with insufficient information to code with codable children: Secondary | ICD-10-CM

## 2013-11-01 LAB — FRUCTOSAMINE: Fructosamine: 305 umol/L — ABNORMAL HIGH (ref 190–270)

## 2013-11-04 ENCOUNTER — Encounter: Payer: Self-pay | Admitting: Endocrinology

## 2013-11-04 ENCOUNTER — Ambulatory Visit (INDEPENDENT_AMBULATORY_CARE_PROVIDER_SITE_OTHER): Payer: BC Managed Care – PPO | Admitting: Endocrinology

## 2013-11-04 VITALS — BP 128/72 | HR 106 | Temp 97.8°F | Resp 14 | Ht 63.0 in | Wt 181.6 lb

## 2013-11-04 DIAGNOSIS — IMO0002 Reserved for concepts with insufficient information to code with codable children: Secondary | ICD-10-CM

## 2013-11-04 DIAGNOSIS — E785 Hyperlipidemia, unspecified: Secondary | ICD-10-CM

## 2013-11-04 DIAGNOSIS — E1065 Type 1 diabetes mellitus with hyperglycemia: Secondary | ICD-10-CM

## 2013-11-04 NOTE — Progress Notes (Signed)
Patient ID: [name], female   DOB: August 07, 1970, 43 y.o.   MRN: [id number]   Reason for Appointment: Insulin Pump followup:   History of Present Illness   Diagnosis: Type 1 DIABETES MELITUS, date of diagnosis:2003     DIABETES history: She was initially diagnosed with diabetes during pregnancy and required insulin. She went on insulin again in 2004 and was started on the pump in 2005.  Previously her control has been variable with needing periodic adjustments and A1c around 6-7  CURRENT insulin pump:  [employer]tronic just walk in 6 pm  HISTORY:  She has again been somewhat irregular with her followup and her blood sugars are overall not well controlled She thinks she has some difficulty with doing things consistently as far as her glucose monitoring, boluses, diet and exercise Current blood sugar patterns are as follows:  Her fasting readings are mostly high with an average of 167 only rarely normal or slightly low. Requiring the highest basal rate of 1.5 at 5 AM for her morning blood sugar control  Blood sugars are variable midday and has only a few readings  Blood sugars late afternoon and to be fairly good with occasional high readings  Usually not checking blood sugars right after her evening meal  Blood sugars late at night or consistently high except on t 2 days and averaging 241  Overall glucose average is 191, +/-89. She is checking 3.9 times a day  The following other problems were identified:  Frequently not entering carbohydrates at her meals  She is afraid of doing a full correction bolus late at night and usually reduce her dose a little  Occasionally will forget to bolus before her meal including once last Saturday night when she did a 6 unit bolus late at night resulting in hypoglycemia at 4 AM  Rarely having a low blood sugar right after her bolus  Occasionally may suspend her pump when she is walking and not restart for sometime causing high readings  She  is not clear what her blood sugars are usually doing with exercise  Her A1c was falsely low her last visit at 5.5 and previously had been over 7%  The pump SETTINGS are: Basal rate: 0.525 at midnight, 3 AM = 0.65., 5 AM = 1.5, 7:30 a.m. = 1.0,. 11 AM = 1.40, 4 PM = 0.7,   8 PM = 1.3 and 0.8 at 11 p.m. Carbohydrate ratio 1:15 at breakfast, 1:12  other meals . Correction factor 1: 40, target 90-120  HYPOGLYCEMIC episodes are rare with the lowest reading 52 overnight  EXERCISE: Not consistent  Lab Results  Component Value Date   HGBA1C 5.5 04/22/2013   Lab Results  Component Value Date   MICROALBUR 1.4 04/22/2013   CREATININE 0.8 04/22/2013    MICROALBUMIN has been tested, and the result is normal .  No visits with results within 1 Week(s) from this visit. Latest known visit with results is:  Appointment on 10/28/2013  Component Date Value Ref Range Status  . Fructosamine 10/28/2013 305* 190 - 270 umol/L Final      Medication List       This list is accurate as of: 11/04/13 10:37 AM.  Always use your most recent med list.               glucose blood test strip  Commonly known as:  BAYER CONTOUR NEXT TEST  Test blood sugars 8 times daily.     ibuprofen 200 MG tablet  Commonly known as:  ADVIL,MOTRIN  Take 200 mg by mouth every 6 (six) hours as needed for pain (PRN).     levonorgestrel 20 MCG/24HR IUD  Commonly known as:  MIRENA  1 each by Intrauterine route once.     NOVOLOG 100 UNIT/ML injection  Generic drug:  insulin aspart  use max of 60 units per day with pump as directed.     simvastatin 40 MG tablet  Commonly known as:  ZOCOR  40 mg.        Allergies: No Known Allergies  No past medical history on file.  No past surgical history on file.  No family history on file.  Social History:  reports that she has never smoked. She has never used smokeless tobacco. Her alcohol and drug histories are not on file.  REVIEW of systems:  She has had  hypercholesterolemia and has been on Zocor for a few years.   Lab Results  Component Value Date   CHOL 230* 04/22/2013   HDL 66.90 04/22/2013   LDLDIRECT 150.5 04/22/2013   TRIG 36.0 04/22/2013   CHOLHDL 3 04/22/2013    EXAM:  BP 128/72  Pulse 106  Temp(Src) 97.8 F (36.6 C)  Resp 14  Ht 5\' 3"  (1.6 m)  Wt 181 lb 9.6 oz (82.373 kg)  BMI 32.18 kg/m2  SpO2 98%  ASSESSMENT:  Diabetes 1 with inadequate control As discussed in history of present illness she has overall high readings especially late in the evening and also sporadically higher midday and afternoon Her A1c previously was falsely low but fructosamine indicates only mild increase on this visit Current blood sugar patterns and problems are discussed in history of present illness  She will need to make the following changes:  Increase early morning basal rate again by 0.1  Start checking blood sugars more consistently before lunch and dinner  To make sure and bolus with every meal  Avoid doing a full correction bolus late at night especially if bolusing postprandially. Will reduce her correction factor 1: 50 overnight  If her blood sugars are significantly higher after supper compared to the pre-meal readings will need to change carbohydrate ratio to 1:10  Increase basal rate at 8 PM to 1.4 until midnight  Better coverage of higher fat meals  May consider using the temporary basal while doing her walking if blood sugars tending to drop  Hypercholesterolemia: Labs to be checked on the next visit, discussed needing to be consistently compliant with simvastatin  Counseling time over 50% of today's 25 minute visit  Reather LittlerAjay Deshanae Lindo 11/04/2013, 10:37 AM

## 2013-11-04 NOTE — Patient Instructions (Signed)
Check sugar while eating every meal, before and after exercise  ? More coverage for dinner

## 2013-11-08 ENCOUNTER — Other Ambulatory Visit: Payer: Self-pay | Admitting: Endocrinology

## 2013-11-18 ENCOUNTER — Other Ambulatory Visit: Payer: Self-pay | Admitting: Endocrinology

## 2014-02-02 ENCOUNTER — Other Ambulatory Visit (INDEPENDENT_AMBULATORY_CARE_PROVIDER_SITE_OTHER): Payer: BC Managed Care – PPO

## 2014-02-02 DIAGNOSIS — E1065 Type 1 diabetes mellitus with hyperglycemia: Secondary | ICD-10-CM

## 2014-02-02 DIAGNOSIS — E785 Hyperlipidemia, unspecified: Secondary | ICD-10-CM

## 2014-02-02 DIAGNOSIS — IMO0002 Reserved for concepts with insufficient information to code with codable children: Secondary | ICD-10-CM

## 2014-02-02 LAB — MICROALBUMIN / CREATININE URINE RATIO
Creatinine,U: 150.1 mg/dL
Microalb Creat Ratio: 0.1 mg/g (ref 0.0–30.0)
Microalb, Ur: 0.2 mg/dL (ref 0.0–1.9)

## 2014-02-02 LAB — URINALYSIS, ROUTINE W REFLEX MICROSCOPIC
BILIRUBIN URINE: NEGATIVE
KETONES UR: NEGATIVE
Leukocytes, UA: NEGATIVE
Nitrite: NEGATIVE
Specific Gravity, Urine: 1.02 (ref 1.000–1.030)
TOTAL PROTEIN, URINE-UPE24: NEGATIVE
Urine Glucose: NEGATIVE
Urobilinogen, UA: 0.2 (ref 0.0–1.0)
pH: 6 (ref 5.0–8.0)

## 2014-02-02 LAB — COMPREHENSIVE METABOLIC PANEL
ALK PHOS: 47 U/L (ref 39–117)
ALT: 15 U/L (ref 0–35)
AST: 15 U/L (ref 0–37)
Albumin: 3.8 g/dL (ref 3.5–5.2)
BILIRUBIN TOTAL: 0.6 mg/dL (ref 0.2–1.2)
BUN: 16 mg/dL (ref 6–23)
CO2: 26 mEq/L (ref 19–32)
Calcium: 8.8 mg/dL (ref 8.4–10.5)
Chloride: 107 mEq/L (ref 96–112)
Creatinine, Ser: 0.8 mg/dL (ref 0.4–1.2)
GFR: 78.69 mL/min (ref >60.00)
Glucose, Bld: 85 mg/dL (ref 70–99)
POTASSIUM: 3.9 meq/L (ref 3.5–5.1)
SODIUM: 138 meq/L (ref 135–145)
TOTAL PROTEIN: 6.4 g/dL (ref 6.0–8.3)

## 2014-02-02 LAB — LIPID PANEL
CHOLESTEROL: 224 mg/dL — AB (ref 0–200)
HDL: 78.4 mg/dL (ref >39.00)
LDL CALC: 138 mg/dL — AB (ref 0–99)
NonHDL: 145.6
TRIGLYCERIDES: 37 mg/dL (ref 0.0–149.0)
Total CHOL/HDL Ratio: 3
VLDL: 7.4 mg/dL (ref 0.0–40.0)

## 2014-02-03 LAB — HEMOGLOBIN A1C
ESTIMATED AVERAGE GLUCOSE: 203 mg/dL
Hgb A1c MFr Bld: 8.7 % — ABNORMAL HIGH (ref 4.8–5.6)

## 2014-02-04 LAB — FRUCTOSAMINE: Fructosamine: 334 umol/L — ABNORMAL HIGH (ref 190–270)

## 2014-02-07 ENCOUNTER — Encounter: Payer: Self-pay | Admitting: Endocrinology

## 2014-02-07 ENCOUNTER — Ambulatory Visit (INDEPENDENT_AMBULATORY_CARE_PROVIDER_SITE_OTHER): Payer: BC Managed Care – PPO | Admitting: Endocrinology

## 2014-02-07 VITALS — BP 132/88 | HR 83 | Temp 97.7°F | Resp 16 | Ht 63.0 in | Wt 183.6 lb

## 2014-02-07 DIAGNOSIS — E1065 Type 1 diabetes mellitus with hyperglycemia: Secondary | ICD-10-CM

## 2014-02-07 DIAGNOSIS — E785 Hyperlipidemia, unspecified: Secondary | ICD-10-CM

## 2014-02-07 DIAGNOSIS — IMO0002 Reserved for concepts with insufficient information to code with codable children: Secondary | ICD-10-CM

## 2014-02-07 NOTE — Progress Notes (Signed)
Patient ID: Tammy Vega, female   DOB: 1971/05/19, 43 y.o.   MRN: [id number]   Reason for Appointment: Insulin Pump followup:   History of Present Illness   Diagnosis: Type 1 DIABETES MELITUS, date of diagnosis:2003     DIABETES history: She was initially diagnosed with diabetes during pregnancy and required insulin. She went on insulin again in 2004 and was started on the pump in 2005.  Previously her control has been variable with needing periodic adjustments and A1c around 6-7  CURRENT insulin pump:  Medtronic   HISTORY:  She has again been somewhat irregular with her followup and her blood sugars are overall not well controlled She thinks she has some difficulty with doing things consistently as far as her glucose monitoring, boluses, diet and exercise Current blood sugar patterns are as follows:  Her fasting readings are mostly lower compared to the last time and they were high and her morning basal rate was increased  Blood sugars are markedly increased as the day goes on and highest late in the evening  She is showing considerable variability in her blood sugars especially in the afternoons and evenings   She was having mild hypoglycemia waking up but less recently  Occasionally he tends to get low sugars right after bolusing for high reading  Overall glucose average is 220, previously 191. She is checking 3.9 times a day  PREMEAL Breakfast Lunch Dinner  late p.m.  Overall  Glucose range:  47-400+   91-339   129-359   57-400+    Mean/median:  146     288   220+/-116    The following other problems were identified:  Usually not entering carbohydrates at her meals  Eating out frequently and not being able to watch her diet with relatively high fat meals in the last couple of weeks. However blood sugar was fairly good yesterday when she was home  Sometimes will get a low sugar right after doing a correction bolus even though she is not overriding the  pump  Occasionally will forget to bolus before her meal including such as on last Saturday night  Most of her high readings are related to higher fat meals and usually not doing extended boluses  The pump SETTINGS are: Basal rate: 0.525 at midnight, 3 AM = 0.65., 5 AM = 1.6, 7:30 a.m. = 1.0,. 11 AM = 1.40, 4 PM = 0.7,   8 PM = 1.4  Carbohydrate ratio 1:15 at breakfast, 1:12  other meals . Correction factor 6 AM-10 PM is 1: 40, target 90-120  HYPOGLYCEMIC episodes are less recently but occurring mostly on waking up, once at 2 AM and occasional mild hypoglycemia after doing correction boluses during the day   EXERCISE: some walking, she thinks her response of the blood sugar is variable with exercise  Wt Readings from Last 3 Encounters:  02/07/14 183 lb 9.6 oz (83.28 kg)  11/04/13 181 lb 9.6 oz (82.373 kg)  04/22/13 178 lb 1.6 oz (80.786 kg)    Lab Results  Component Value Date   HGBA1C 8.7* 02/02/2014   HGBA1C 5.5 04/22/2013   Lab Results  Component Value Date   MICROALBUR 0.2 02/02/2014   LDLCALC 138* 02/02/2014   CREATININE 0.8 02/02/2014    MICROALBUMIN has been tested, and the result is normal .  Appointment on 02/02/2014  Component Date Value Ref Range Status  . Hemoglobin A1C 02/02/2014 8.7* 4.8 - 5.6 % Final   Comment:  Increased risk for diabetes: 5.7 - 6.4                                   Diabetes: >6.4                                   Glycemic control for adults with diabetes: <7.0  . Estimated average glucose 02/02/2014 203   Final  . Sodium 02/02/2014 138  135 - 145 mEq/L Final  . Potassium 02/02/2014 3.9  3.5 - 5.1 mEq/L Final  . Chloride 02/02/2014 107  96 - 112 mEq/L Final  . CO2 02/02/2014 26  19 - 32 mEq/L Final  . Glucose, Bld 02/02/2014 85  70 - 99 mg/dL Final  . BUN 40/98/1191 16  6 - 23 mg/dL Final  . Creatinine, Ser 02/02/2014 0.8  0.4 - 1.2 mg/dL Final  . Total Bilirubin 02/02/2014 0.6  0.2 - 1.2 mg/dL Final  . Alkaline Phosphatase 02/02/2014 47   39 - 117 U/L Final  . AST 02/02/2014 15  0 - 37 U/L Final  . ALT 02/02/2014 15  0 - 35 U/L Final  . Total Protein 02/02/2014 6.4  6.0 - 8.3 g/dL Final  . Albumin 47/82/9562 3.8  3.5 - 5.2 g/dL Final  . Calcium 13/02/6577 8.8  8.4 - 10.5 mg/dL Final  . GFR 46/96/2952 78.69  >60.00 mL/min Final  . Microalb, Ur 02/02/2014 0.2  0.0 - 1.9 mg/dL Final  . Creatinine,U 84/13/2440 150.1   Final  . Microalb Creat Ratio 02/02/2014 0.1  0.0 - 30.0 mg/g Final  . Cholesterol 02/02/2014 224* 0 - 200 mg/dL Final   ATP III Classification       Desirable:  < 200 mg/dL               Borderline High:  200 - 239 mg/dL          High:  > = 102 mg/dL  . Triglycerides 02/02/2014 37.0  0.0 - 149.0 mg/dL Final   Normal:  <725 mg/dLBorderline High:  150 - 199 mg/dL  . HDL 02/02/2014 78.40  >39.00 mg/dL Final  . VLDL 36/64/4034 7.4  0.0 - 74.2 mg/dL Final  . LDL Cholesterol 02/02/2014 138* 0 - 99 mg/dL Final  . Total CHOL/HDL Ratio 02/02/2014 3   Final                  Men          Women1/2 Average Risk     3.4          3.3Average Risk          5.0          4.42X Average Risk          9.6          7.13X Average Risk          15.0          11.0                      . NonHDL 02/02/2014 145.60   Final  . Fructosamine 02/02/2014 334* 190 - 270 umol/L Final  . Color, Urine 02/02/2014 YELLOW  Yellow;Lt. Yellow Final  . APPearance 02/02/2014 CLEAR  Clear Final  . Specific Gravity, Urine 02/02/2014 1.020  1.000-1.030 Final  .  pH 02/02/2014 6.0  5.0 - 8.0 Final  . Total Protein, Urine 02/02/2014 NEGATIVE  Negative Final  . Urine Glucose 02/02/2014 NEGATIVE  Negative Final  . Ketones, ur 02/02/2014 NEGATIVE  Negative Final  . Bilirubin Urine 02/02/2014 NEGATIVE  Negative Final  . Hgb urine dipstick 02/02/2014 SMALL* Negative Final  . Urobilinogen, UA 02/02/2014 0.2  0.0 - 1.0 Final  . Leukocytes, UA 02/02/2014 NEGATIVE  Negative Final  . Nitrite 02/02/2014 NEGATIVE  Negative Final  . WBC, UA 02/02/2014 0-2/hpf  0-2/hpf  Final  . RBC / HPF 02/02/2014 0-2/hpf  0-2/hpf Final  . Squamous Epithelial / LPF 02/02/2014 Rare(0-4/hpf)  Rare(0-4/hpf) Final      Medication List       This list is accurate as of: 02/07/14 10:08 AM.  Always use your most recent med list.               BAYER CONTOUR NEXT TEST test strip  Generic drug:  glucose blood  test blood sugars 8 times daily.     ibuprofen 200 MG tablet  Commonly known as:  ADVIL,MOTRIN  Take 200 mg by mouth every 6 (six) hours as needed for pain (PRN).     levonorgestrel 20 MCG/24HR IUD  Commonly known as:  MIRENA  1 each by Intrauterine route once.     NOVOLOG 100 UNIT/ML injection  Generic drug:  insulin aspart  USE MAX OF 60 UNITS PER DAY WITH PUMP AS DIRECTED.     simvastatin 40 MG tablet  Commonly known as:  ZOCOR  40 mg.        Allergies: No Known Allergies  No past medical history on file.  No past surgical history on file.  No family history on file.  Social History:  reports that she has never smoked. She has never used smokeless tobacco. Her alcohol and drug histories are not on file.  REVIEW of systems:  She has had hypercholesterolemia and has been on Zocor for a few years, irregular recently when she was out of town and LDL is again high.   Lab Results  Component Value Date   CHOL 224* 02/02/2014   HDL 78.40 02/02/2014   LDLCALC 138* 02/02/2014   LDLDIRECT 150.5 04/22/2013   TRIG 37.0 02/02/2014   CHOLHDL 3 02/02/2014    EXAM:  BP 132/88  Pulse 83  Temp(Src) 97.7 F (36.5 C)  Resp 16  Ht 5\' 3"  (1.6 m)  Wt 183 lb 9.6 oz (83.28 kg)  BMI 32.53 kg/m2  SpO2 96%  ASSESSMENT:  Diabetes 1 with inadequate control As discussed in history of present illness she has overall high readings especially late in the afternoon and evening  She thinks this is mostly from eating out a lot and poor diet as well as inconsistent exercise She did have fairly good blood sugars yesterday although they are getting to be high or midday  and afternoon Was having low blood sugars waking up but this is less now Current blood sugar patterns and problems are discussed in history of present illness  She will need to make the following changes:  Increase basal rates after 11 AM by 0.05 for now  To call if blood sugars are consistently higher later in the day  Start checking blood sugars more consistently throughout the day  To make sure and bolus with every meal  May try 1:45 correction factor during the day  She will get her continuous glucose monitor started soon and try to make  changes in her management based on this. Discussed differences between Medtronic and DexCom brands  Better coverage of higher fat meals including extended boluses  May consider using the temporary basal while doing her walking if blood sugars tending to drop  Hypercholesterolemia: Not controlled, again discussed needing to be consistently compliant with simvastatin  Counseling time over 50% of today's 25 minute visit  Mckenlee Mangham 02/07/2014, 10:08 AM

## 2014-02-07 NOTE — Patient Instructions (Signed)
Change basals

## 2014-02-16 ENCOUNTER — Ambulatory Visit: Payer: BC Managed Care – PPO | Admitting: Nutrition

## 2014-02-21 ENCOUNTER — Encounter: Payer: BC Managed Care – PPO | Attending: Endocrinology | Admitting: Nutrition

## 2014-02-21 DIAGNOSIS — E1065 Type 1 diabetes mellitus with hyperglycemia: Secondary | ICD-10-CM

## 2014-02-21 DIAGNOSIS — IMO0002 Reserved for concepts with insufficient information to code with codable children: Secondary | ICD-10-CM

## 2014-02-22 NOTE — Progress Notes (Signed)
Pt. Came in with her new pump and CGM.  She had a Medtronic Revel and is upgrading to the 530G.  She has already put the data into the pump from her old pump.  I checked this, and everything was put in correctly.  We discussed the differences between the sensor reading and blood sugar readings, and the need to bolus only from blood sugar readings.  She reported good understanding of this.  We reviewed how to insert and start a sensor, and she did this correctly.  The settings were put into the pump:  Threshold suspend: 70, Glucose limits: 80-250, low predictive alert: 30 min., high predictive alert: off, rate alerts: off, repeat high alert: 2 hours, cal. Reminder: 30 min., cal repeat: off.  We reviewed all of the alarms, and how to shut them off, and she reported good understanding of this.    We also discussed the need to calibrate the meter in 2hr. And again, in the next 6 hours.  She was told to do a calibration after that, every 12 hours.  We reviewed how to do a calibration, and she reported good understanding of this, and when this needs to be done.  She will set up her [facility], and call with her log in and password in 1 week.  She was shown the manual for how to do this, and given the telephone number for directions for this if she has any difficulty.  She had no final questions.

## 2014-02-23 NOTE — Patient Instructions (Signed)
Calibrate the sensor in 2 hours, and again sometime in the next 6 hours. Calibrate once every 12 hours after that. Set up a [facility] account, and call with your password and login information. Call if questions, or problems.

## 2014-03-17 ENCOUNTER — Other Ambulatory Visit: Payer: Self-pay | Admitting: Endocrinology

## 2014-03-17 DIAGNOSIS — IMO0002 Reserved for concepts with insufficient information to code with codable children: Secondary | ICD-10-CM

## 2014-03-17 DIAGNOSIS — E1065 Type 1 diabetes mellitus with hyperglycemia: Secondary | ICD-10-CM

## 2014-03-18 ENCOUNTER — Ambulatory Visit: Payer: BC Managed Care – PPO | Admitting: Endocrinology

## 2014-05-13 ENCOUNTER — Other Ambulatory Visit: Payer: Self-pay | Admitting: Endocrinology

## 2014-06-10 ENCOUNTER — Other Ambulatory Visit: Payer: Self-pay | Admitting: Endocrinology

## 2014-06-15 ENCOUNTER — Other Ambulatory Visit: Payer: Self-pay | Admitting: Endocrinology

## 2014-06-18 ENCOUNTER — Other Ambulatory Visit: Payer: Self-pay | Admitting: Endocrinology

## 2014-08-18 ENCOUNTER — Other Ambulatory Visit: Payer: Self-pay | Admitting: Endocrinology

## 2014-11-23 ENCOUNTER — Telehealth: Payer: Self-pay | Admitting: Endocrinology

## 2014-11-23 NOTE — Telephone Encounter (Signed)
Tammy Vega from [facility], need a verbal order for Novalog [phone]

## 2014-11-23 NOTE — Telephone Encounter (Signed)
Patient needs appointment before refills given

## 2014-12-19 ENCOUNTER — Telehealth: Payer: Self-pay | Admitting: Endocrinology

## 2014-12-19 NOTE — Telephone Encounter (Signed)
Patient need refill of Novalog and test strips

## 2014-12-20 ENCOUNTER — Other Ambulatory Visit: Payer: Self-pay | Admitting: *Deleted

## 2014-12-20 MED ORDER — INSULIN ASPART 100 UNIT/ML ~~LOC~~ SOLN: SUBCUTANEOUS | Status: DC

## 2014-12-20 MED ORDER — GLUCOSE BLOOD VI STRP: ORAL_STRIP | Status: DC

## 2015-01-11 ENCOUNTER — Other Ambulatory Visit: Payer: BC Managed Care – PPO

## 2015-01-11 DIAGNOSIS — E1065 Type 1 diabetes mellitus with hyperglycemia: Secondary | ICD-10-CM

## 2015-01-11 DIAGNOSIS — IMO0002 Reserved for concepts with insufficient information to code with codable children: Secondary | ICD-10-CM

## 2015-01-11 LAB — HEMOGLOBIN A1C: Hgb A1c MFr Bld: 5.4 % (ref 4.6–6.5)

## 2015-01-13 ENCOUNTER — Ambulatory Visit (INDEPENDENT_AMBULATORY_CARE_PROVIDER_SITE_OTHER): Payer: BC Managed Care – PPO | Admitting: Endocrinology

## 2015-01-13 ENCOUNTER — Encounter: Payer: Self-pay | Admitting: Endocrinology

## 2015-01-13 VITALS — BP 124/72 | HR 100 | Temp 98.0°F | Resp 16 | Ht 63.0 in | Wt 192.4 lb

## 2015-01-13 DIAGNOSIS — E7800 Pure hypercholesterolemia, unspecified: Secondary | ICD-10-CM

## 2015-01-13 DIAGNOSIS — E1065 Type 1 diabetes mellitus with hyperglycemia: Secondary | ICD-10-CM | POA: Diagnosis not present

## 2015-01-13 DIAGNOSIS — IMO0002 Reserved for concepts with insufficient information to code with codable children: Secondary | ICD-10-CM

## 2015-01-13 NOTE — Progress Notes (Signed)
Patient ID: Tammy Vega, female   DOB: March 14, 1971, 44 y.o.   MRN: [id number]   Reason for Appointment: Insulin Pump followup:   History of Present Illness   Diagnosis: Type 1 DIABETES MELITUS, date of diagnosis:2003     DIABETES history: She was initially diagnosed with diabetes during pregnancy and required insulin. She went on insulin again in 2004 and was started on the pump in 2005.  Previously her control has been variable with needing periodic adjustments and A1c around 6-7  CURRENT insulin pump:  Medtronic   HISTORY:    PUMP SETTINGS are: Basal rate: 0.5 at midnight, 3 AM = 0.6., 5 AM = 1.55, 7:30 a.m. = 1.2,. 11 AM = 1.40, 4 PM = 0.75,  8 PM = 1.45   Carbohydrate ratio 1:15 at breakfast, 1:12  other meals . Correction factor 1:50 until 6 p.m. and then 1:45 until 10 PM , target 90-100  She has again been noncompliant with her follow-up and has not been seen for almost a year She is not using the continuous glucose monitor on her new insulin pump However she is still not getting any better control with her home blood sugars averaging about 220 again as before  She may have a hemoglobinopathy and her A1c is usually falsely low unless done from [facility]  Current blood sugar patterns are as follows:  Her fasting readings are generally only mildly increased especially recently  Blood sugars are tending to be relatively higher midmorning even with her eating only very small amount of carbohydrates or none.  Also recently has been walking in the mornings but this does not appear to improve her blood sugars  She is not checking her blood sugars much around lunchtime but according to the continuous sensor her blood sugars are tending to decline right before lunch although not low  Blood sugars are significantly higher in the afternoons with or without a bolus and stable persistently high throughout the evening until about 1 AM mostly  She thinks that her high  readings late at night are related to high fat meals and she is not extending her boluses  Difficult to identify mealtime patterns as she does not enter carbohydrates in the pump and not clear which boluses are 4 food or whether they are for high readings  She has not changed her basal rates much since her last visit even though she has had persistently high readings in the afternoons and evenings which are more evident on her continuous glucose monitoring; does have relatively higher basal rate at 7:30 AM   CGM RECORD INTERPRETATION    Dates of Recording: 12/30/14 through 01/12/15          Sensor  summary:   She has been using the sensor daily with missing tracings only on 3 days of the last 2 weeks AVERAGE blood sugar is 202+/-63.  During her sensor use she had 5.2 high alarms and 0.3 low alarms. She has not had any threshold suspends.       Glycemic patterns:   Her blood sugars are overall the highest between about 7 PM-1 AM.  However blood sugars are mostly on average about 140 at most times with only transient average recordings at about 7 AM or 12-1 PM of about 140.  No hypoglycemia except very transiently had low normal reading at 1 AM once.  She has had one hypoglycemic episode preceded by hyperglycemia.  Hyperglycemia episodes are mostly proceeded with boluses and rising sensor  rate of change      Overnight periods:   Her blood sugars are mostly starting off variably high on midnight and declining progressively down to about 140 average by 7 AM.  She does have a few nights when the blood sugars are more consistently averaging about 140-160 and only rarely in the low normal range.  Blood sugar was very transiently low about 1 AM on the night of 6/5      Preprandial periods:   blood sugars are excellent fasting on an average and lunchtime but mostly high before supper time.  Her suppertime is difficult to identify because she has multiple boluses in the evenings and not entering carbohydrates  at those times      Postprandial periods:   These are difficult to identify she is not entering her carbohydrates at meals but blood sugars only modestly high after breakfast but generally going up to an average of around 200 after lunch and they are relatively flat after dinner but blood sugars are usually high even before eating     Hypoglycemia:  minimal on one night as above        GLUCOSE readings from fingerstick data:  Mean values apply above for all meters except median for One Touch  PRE-MEAL Fasting Lunch Dinner Bedtime Overall  Glucose range:  76-192   142-267   183-299   73-371    Mean/median:  154    297   248   221+/-77     EXERCISE: some walking in the mornings, she thinks her response of the blood sugar is variable with exerciseBut recently not getting low with exercise and not using temporary basal  Wt Readings from Last 3 Encounters:  01/13/15 192 lb 6.4 oz (87.272 kg)  02/07/14 183 lb 9.6 oz (83.28 kg)  11/04/13 181 lb 9.6 oz (82.373 kg)    Lab Results  Component Value Date   HGBA1C 5.4 01/11/2015   HGBA1C 8.7* 02/02/2014   HGBA1C 5.5 04/22/2013   Lab Results  Component Value Date   MICROALBUR 0.2 02/02/2014   LDLCALC 138* 02/02/2014   CREATININE 0.8 02/02/2014     .  Lab on 01/11/2015  Component Date Value Ref Range Status  . Hgb A1c MFr Bld 01/11/2015 5.4  4.6 - 6.5 % Final   Glycemic Control Guidelines for People with Diabetes:Non Diabetic:  <6%Goal of Therapy: <7%Additional Action Suggested:  >8%       Medication List       This list is accurate as of: 01/13/15  4:37 PM.  Always use your most recent med list.               glucose blood test strip  Commonly known as:  BAYER CONTOUR NEXT TEST  USE TO TEST BLOOD SUGAR 8 TIMES DAILY     ibuprofen 200 MG tablet  Commonly known as:  ADVIL,MOTRIN  Take 200 mg by mouth every 6 (six) hours as needed for pain (PRN).     insulin aspart 100 UNIT/ML injection  Commonly known as:  NOVOLOG   Use max of 60 units per day with pump as directed     levonorgestrel 20 MCG/24HR IUD  Commonly known as:  MIRENA  1 each by Intrauterine route once.     simvastatin 40 MG tablet  Commonly known as:  ZOCOR  TAKE ONE TABLET BY MOUTH ONE TIME DAILY        Allergies: No Known Allergies  No past medical history on  file.  No past surgical history on file.  Family History  Problem Relation Age of Onset  . Hyperlipidemia Mother   . Diabetes Neg Hx   . Heart disease Neg Hx     Social History:  reports that she has never smoked. She has never used smokeless tobacco. Her alcohol and drug histories are not on file.  REVIEW of systems:  She has had hypercholesterolemia and has been on Zocor for a few years, irregular recently when she was out of town and LDL is again high.   Lab Results  Component Value Date   CHOL 224* 02/02/2014   HDL 78.40 02/02/2014   LDLCALC 138* 02/02/2014   LDLDIRECT 150.5 04/22/2013   TRIG 37.0 02/02/2014   CHOLHDL 3 02/02/2014    EXAM:  BP 124/72 mmHg  Pulse 100  Temp(Src) 98 F (36.7 C)  Resp 16  Ht 5\' 3"  (1.6 m)  Wt 192 lb 6.4 oz (87.272 kg)  BMI 34.09 kg/m2  SpO2 98%  ASSESSMENT:  Diabetes 1 with inadequate control See history of present illness for detailed discussion of his current management, blood sugar patterns and problems identified Her blood sugars are poorly controlled as discussed above Despite using continuous glucose monitoring she has not modified her pump settings to contract her hyperglycemia that is occurring in the afternoons and evenings and until late at night She is also not bolusing enough for her lunch and evening meals; also not clear what carbohydrate ratio she is using since she does not enter carbohydrates in the pump and is doing all the boluses manually  Discussed with the patient that she needs to have better management of her diabetes to prevent consistent hyperglycemia with increase basal rates at that time  she is hyperglycemic as well as better management of mealtime boluses She does need to add extra insulin for higher fat meals and extend the bolus which she is not doing Again having difficulty losing weight   She will need to make the following changes:  NEW basal rates between 11 AM-midnight are as follows: 11 AM = 1.3, 1 PM = 1.45, 4 PM = 0.9, 7 PM = 1.6 and 11 PM = 0.7  Carbohydrate ratio 1:10 at lunch and supper   Try to enter carbohydrates at mealtimes   Better coverage of higher fat meals including extended boluses  Will need to have her follow-up and short-term to make sure her blood sugars are better controlled.  Also will need to have her A1c now done through [facility] for better accuracy  HYPERCHOLESTEROLEMIA :  needs follow-up level  Counseling time on subjects discussed above is over 50% of today's 25 minute visit    Lillan Mccreadie 01/13/2015, 4:37 PM

## 2015-01-20 ENCOUNTER — Telehealth: Payer: Self-pay | Admitting: Endocrinology

## 2015-01-20 ENCOUNTER — Other Ambulatory Visit: Payer: Self-pay | Admitting: *Deleted

## 2015-01-20 MED ORDER — INSULIN ASPART 100 UNIT/ML ~~LOC~~ SOLN: SUBCUTANEOUS | Status: DC

## 2015-01-20 NOTE — Telephone Encounter (Signed)
Patient called to get her Rx sent to a new pharmacy   Please discontinue [facility]   Pharmacy: [facility]    Thank you

## 2015-01-20 NOTE — Telephone Encounter (Signed)
rx sent

## 2015-01-24 ENCOUNTER — Encounter: Payer: Self-pay | Admitting: *Deleted

## 2015-01-24 LAB — HM DIABETES EYE EXAM

## 2015-02-21 ENCOUNTER — Other Ambulatory Visit: Payer: Self-pay | Admitting: *Deleted

## 2015-02-21 ENCOUNTER — Telehealth: Payer: Self-pay

## 2015-02-21 MED ORDER — GLUCOSE BLOOD VI STRP: ORAL_STRIP | Status: DC

## 2015-02-21 NOTE — Telephone Encounter (Signed)
rx sent

## 2015-02-21 NOTE — Telephone Encounter (Signed)
Pt called requesting a refill on her Bayer contour test strips. Pt would like this prescription to be sent to her local [facility] pharmacy.

## 2015-03-20 ENCOUNTER — Other Ambulatory Visit: Payer: Self-pay | Admitting: Endocrinology

## 2015-03-22 ENCOUNTER — Ambulatory Visit: Payer: BC Managed Care – PPO | Admitting: Endocrinology

## 2015-04-27 ENCOUNTER — Encounter: Payer: Self-pay | Admitting: Endocrinology

## 2015-04-27 ENCOUNTER — Ambulatory Visit (INDEPENDENT_AMBULATORY_CARE_PROVIDER_SITE_OTHER): Payer: BC Managed Care – PPO | Admitting: Endocrinology

## 2015-04-27 DIAGNOSIS — E1065 Type 1 diabetes mellitus with hyperglycemia: Secondary | ICD-10-CM

## 2015-04-27 DIAGNOSIS — E7800 Pure hypercholesterolemia, unspecified: Secondary | ICD-10-CM

## 2015-04-27 DIAGNOSIS — IMO0002 Reserved for concepts with insufficient information to code with codable children: Secondary | ICD-10-CM

## 2015-04-27 NOTE — Progress Notes (Signed)
Patient ID: Tammy Vega, female   DOB: 16-Apr-1971, 44 y.o.   MRN: [id number]   Reason for Appointment: Insulin Pump followup:   History of Present Illness   Diagnosis: Type 1 DIABETES MELITUS, date of diagnosis:2003     DIABETES history: She was initially diagnosed with diabetes during pregnancy and required insulin. She went on insulin again in 2004 and was started on the pump in 2005.  Previously her control has been variable with needing periodic adjustments and A1c around 6-7  CURRENT insulin pump:  Medtronic   HISTORY:    PUMP SETTINGS:  Basal rates are as follows: 0.5 at midnight, 3 AM = 0.6., 5 AM = 1.55, 7:30 a.m. = 1.2,. 11 AM = 1.3, 1 PM = 1.45, 4 PM = 0.9, 7 PM = 1.6 and 11 PM = 0.65 Carbohydrate ratio 1:10 at lunch and supper Sensitivity 1:50, at 6 PM 1:45 with target 90-100  She was scheduled to be seen in 2 months after her last visit but is now coming here 3 months later for reevaluation of her control. She continues to use her CGM with the threshold suspends feature Overall still has relatively high control, A1c pending She may have a hemoglobinopathy and her A1c is usually falsely low unless done from [facility]  GLUCOSE readings from fingerstick data:  Mean values apply above for all meters except median for One Touch  PRE-MEAL Fasting Lunch Dinner Bedtime Overall  Glucose range:  145-259   64-178   57-310   66-343    Mean/median:  217     202   205+/-85    POST-MEAL PC Breakfast PC Lunch PC Dinner  Glucose range:    67-348   Mean/median:       Current blood sugar patterns are as follows:  Her fasting readings are now relatively higher, fairly consistently  Also overnight readings appear to be higher on her CGM  Her blood sugars are mildly increased around lunchtime on average but are tend to be low before supper  POSTPRANDIAL readings are relatively good after breakfast but significantly higher after lunch and somewhat high  after dinner  She now says that she will frequently forget to bolus before starting to eat lunch and may not bolus and may not bolus until blood sugar goes up; however her blood sugars are fairly consistently high after lunch with average of about 250 at peak   HYPOGLYCEMIA: This is occurring periodically at suppertime and she has had her threshold suspends occur at least twice in the evening  CGM RECORD INTERPRETATION    Dates of Recording: 04/13/15 through 04/26/15  Sensor  summary:  Average blood glucose for the period of recording is 188 with standard deviation 58 and she is using this on an average 6 days + per week.  Her blood sugars are the highest in the mid afternoon and lowest around 6-7 PM.  Hypoglycemia as occasionally occurring before supper.  However overnight blood sugars are fairly flat and modestly increased overall      Glycemic patterns:  Hyperglycemic episodes: 50 in the last 2 weeks with 23 preceded by bolus with the rising Sensor rate of change.  Occasionally occurring after bolus wizard override or bolus wizard food bolus  Hypoglycemic episodes occurred twice, once preceded by hyperglycemia      Overnight periods:   blood sugars are relatively stable with some variability on different days throughout the night and he average  blood sugar is mostly around 160-180, there is a trend towards the blood sugar rising after 4 AM.  No hypoglycemia      Preprandial periods:   blood sugars are moderately higher before lunch, less so before lunch and tending to be low normal before dinner.       Postprandial periods:   blood sugars did not appear to be usually rising after breakfast on an average but has a few high readings.  Blood sugars after her lunch in the early afternoon fairly consistently going up with the peak average of about 250. Post dinner blood sugars are mostly rising but there is variability.  Her peak reading after dinner is usually 1-2 hours later but does have occasional  prolonged rise in blood sugar into the midnight hour      Hypoglycemia:  only once before dinner around 6:30 PM       EXERCISE: some walking    Wt Readings from Last 3 Encounters:  04/27/15 193 lb 9.6 oz (87.816 kg)  01/13/15 192 lb 6.4 oz (87.272 kg)  02/07/14 183 lb 9.6 oz (83.28 kg)    Lab Results  Component Value Date   HGBA1C 5.4 01/11/2015   HGBA1C 8.7* 02/02/2014   HGBA1C 5.5 04/22/2013   Lab Results  Component Value Date   MICROALBUR 0.2 02/02/2014   LDLCALC 138* 02/02/2014   CREATININE 0.8 02/02/2014     .  No visits with results within 1 Week(s) from this visit. Latest known visit with results is:  Abstract on 01/24/2015  Component Date Value Ref Range Status  . HM Diabetic Eye Exam 01/24/2015 No Retinopathy  No Retinopathy Final      Medication List       This list is accurate as of: 04/27/15  8:22 PM.  Always use your most recent med list.               BAYER CONTOUR NEXT TEST test strip  Generic drug:  glucose blood  USE AS DIRECTED TO TEST BLOOD SUGAR 8 TIMES DAILY     ibuprofen 200 MG tablet  Commonly known as:  ADVIL,MOTRIN  Take 200 mg by mouth every 6 (six) hours as needed for pain (PRN).     insulin aspart 100 UNIT/ML injection  Commonly known as:  NOVOLOG  Use max of 60 units per day with pump as directed     levonorgestrel 20 MCG/24HR IUD  Commonly known as:  MIRENA  1 each by Intrauterine route once.     simvastatin 40 MG tablet  Commonly known as:  ZOCOR  TAKE ONE TABLET BY MOUTH ONE TIME DAILY        Allergies: No Known Allergies  No past medical history on file.  No past surgical history on file.  Family History  Problem Relation Age of Onset  . Hyperlipidemia Mother   . Diabetes Neg Hx   . Heart disease Neg Hx     Social History:  reports that she has never smoked. She has never used smokeless tobacco. Her alcohol and drug histories are not on file.  REVIEW of systems:  She has had hypercholesterolemia and  has been on Zocor for a few years, needs follow-up levels  Lab Results  Component Value Date   CHOL 224* 02/02/2014   HDL 78.40 02/02/2014   LDLCALC 138* 02/02/2014   LDLDIRECT 150.5 04/22/2013   TRIG 37.0 02/02/2014   CHOLHDL 3 02/02/2014    EXAM:  BP 136/82 mmHg  Pulse 99  Temp(Src) 98.2 F (36.8 C)  Resp 16  Ht  (1.6 m)  Wt 193 lb 9.6 oz (87.816 kg)  BMI 34.30 kg/m2  SpO2 97%  ASSESSMENT:  Diabetes 1 with inadequate control See history of present illness for detailed discussion of his current management, blood sugar patterns and problems identified Her blood sugars are still not adequately controlled as discussed above  Despite using continuous glucose monitoring she has not been able to get consistently better control Her blood sugar trends include high blood sugars before breakfast consistently, mostly overnight hyperglycemia and significant rise in blood sugar in the mid afternoon after lunch.  Has some rise after evening meals which can be occasionally prolonged. She does not consistently bolus before eating lunch as she forgets and will bolus later  She tends to have hypoglycemia before dinner which may be partly related to excess basal rate but also due to late bolusing after lunch Most of her hyperglycemia postprandially in the afternoon and evenings is related to inadequate coverage both of carbohydrate and fat. She has occasionally used the dual wave bolus for only for about an hour and not usually adding enough insulin to carbohydrate content Again having difficulty losing weight   She will need to make the following changes:  NEW basal rates are as follows: Midnight = 0.6, 3 AM = 0.7, 5 AM = 1.7, 4 PM = 0.8 and 7 PM = 1.7  Carbohydrate ratio 1:8 at lunch and dinner and continue 1:15 at breakfast.  No change in sensitivity as yet   Try to enter carbohydrates at mealtimes   Better coverage of higher fat meals including extended boluses for about 2  hours and may use 60% of the bolus immediately  A1c will be checked from [facility] today  HYPERCHOLESTEROLEMIA :  needs follow-up level today  Counseling time on subjects discussed above is over 50% of today's 25 minute visit   Myria Steenbergen 04/27/2015, 8:22 PM

## 2015-04-28 LAB — COMPREHENSIVE METABOLIC PANEL
ALBUMIN: 4.3 g/dL (ref 3.5–5.2)
ALK PHOS: 50 U/L (ref 39–117)
ALT: 13 U/L (ref 0–35)
AST: 13 U/L (ref 0–37)
BUN: 20 mg/dL (ref 6–23)
CO2: 27 mEq/L (ref 19–32)
CREATININE: 1 mg/dL (ref 0.40–1.20)
Calcium: 9.4 mg/dL (ref 8.4–10.5)
Chloride: 103 mEq/L (ref 96–112)
GFR: 63.98 mL/min (ref >60.00)
Glucose, Bld: 215 mg/dL — ABNORMAL HIGH (ref 70–99)
POTASSIUM: 4.3 meq/L (ref 3.5–5.1)
Sodium: 138 mEq/L (ref 135–145)
TOTAL PROTEIN: 6.9 g/dL (ref 6.0–8.3)
Total Bilirubin: 0.3 mg/dL (ref 0.2–1.2)

## 2015-04-28 LAB — MICROALBUMIN / CREATININE URINE RATIO
CREATININE, U: 113.5 mg/dL
Microalb Creat Ratio: 0.6 mg/g (ref 0.0–30.0)

## 2015-04-28 LAB — LIPID PANEL
CHOLESTEROL: 229 mg/dL — AB (ref 0–200)
HDL: 65.4 mg/dL (ref >39.00)
LDL CALC: 153 mg/dL — AB (ref 0–99)
NonHDL: 163.68
Total CHOL/HDL Ratio: 4
Triglycerides: 55 mg/dL (ref 0.0–149.0)
VLDL: 11 mg/dL (ref 0.0–40.0)

## 2015-04-29 LAB — HEMOGLOBIN A1C
Est. average glucose Bld gHb Est-mCnc: 186 mg/dL
HEMOGLOBIN A1C: 8.1 % — AB (ref 4.8–5.6)

## 2015-05-02 NOTE — Progress Notes (Signed)
Quick Note:  Please let patient know that the glucose is out of control and needs to be seen as soon as possible ______

## 2015-05-18 ENCOUNTER — Other Ambulatory Visit: Payer: Self-pay | Admitting: Endocrinology

## 2015-05-22 ENCOUNTER — Other Ambulatory Visit: Payer: Self-pay | Admitting: Endocrinology

## 2015-06-26 ENCOUNTER — Other Ambulatory Visit: Payer: Self-pay | Admitting: *Deleted

## 2015-06-26 MED ORDER — SIMVASTATIN 40 MG PO TABS: 40.0000 mg | ORAL_TABLET | Freq: Every day | ORAL | Status: DC

## 2015-06-28 ENCOUNTER — Other Ambulatory Visit: Payer: Self-pay | Admitting: *Deleted

## 2015-06-28 MED ORDER — SIMVASTATIN 40 MG PO TABS: 40.0000 mg | ORAL_TABLET | Freq: Every day | ORAL | Status: DC

## 2015-07-25 ENCOUNTER — Other Ambulatory Visit (INDEPENDENT_AMBULATORY_CARE_PROVIDER_SITE_OTHER): Payer: BC Managed Care – PPO

## 2015-07-25 DIAGNOSIS — E1065 Type 1 diabetes mellitus with hyperglycemia: Secondary | ICD-10-CM | POA: Diagnosis not present

## 2015-07-25 DIAGNOSIS — IMO0002 Reserved for concepts with insufficient information to code with codable children: Secondary | ICD-10-CM

## 2015-07-25 LAB — BASIC METABOLIC PANEL
BUN: 19 mg/dL (ref 6–23)
CALCIUM: 9 mg/dL (ref 8.4–10.5)
CO2: 26 mEq/L (ref 19–32)
CREATININE: 0.8 mg/dL (ref 0.40–1.20)
Chloride: 104 mEq/L (ref 96–112)
GFR: 82.68 mL/min (ref >60.00)
GLUCOSE: 163 mg/dL — AB (ref 70–99)
Potassium: 4.1 mEq/L (ref 3.5–5.1)
Sodium: 137 mEq/L (ref 135–145)

## 2015-07-26 LAB — HEMOGLOBIN A1C
Est. average glucose Bld gHb Est-mCnc: 186 mg/dL
HEMOGLOBIN A1C: 8.1 % — AB (ref 4.8–5.6)

## 2015-07-27 ENCOUNTER — Ambulatory Visit (INDEPENDENT_AMBULATORY_CARE_PROVIDER_SITE_OTHER): Payer: BC Managed Care – PPO | Admitting: Endocrinology

## 2015-07-27 ENCOUNTER — Encounter: Payer: Self-pay | Admitting: Endocrinology

## 2015-07-27 VITALS — BP 124/72 | HR 88 | Temp 97.9°F | Resp 14 | Ht 63.0 in | Wt 198.4 lb

## 2015-07-27 DIAGNOSIS — E7800 Pure hypercholesterolemia, unspecified: Secondary | ICD-10-CM | POA: Diagnosis not present

## 2015-07-27 DIAGNOSIS — E1065 Type 1 diabetes mellitus with hyperglycemia: Secondary | ICD-10-CM | POA: Diagnosis not present

## 2015-07-27 NOTE — Patient Instructions (Signed)
Take Simvastatin in ams  Bolus 1:8 at Bfst and dinner

## 2015-07-27 NOTE — Progress Notes (Signed)
Patient ID: Tammy Vega, female   DOB: 1970/09/03, 44 y.o.   MRN: [id number]   Reason for Appointment: Insulin Pump followup:   History of Present Illness   Diagnosis: Type 1 DIABETES MELITUS, date of diagnosis:2003     DIABETES history: She was initially diagnosed with diabetes during pregnancy and required insulin. She went on insulin again in 2004 and was started on the pump in 2005.  Previously her control has been variable with needing periodic adjustments and A1c around 6-7  CURRENT insulin pump:  Medtronic 530 G  HISTORY:     PUMP SETTINGS:  Her current basal rates are as follows: Midnight = 0.7, 5 AM = 1.7, 7:30 AM = 1.2, 11 AM = 1.3, 1 PM = 1.45, 4 PM = 0.8 and 7 PM = 1.7, 11 PM = 0.65   Basal rates are as follows: 0.5 at midnight, 3 AM = 0.6., 5 AM = 1.55, 7:30 a.m. = 1.2,. 11 AM = 1.3, 1 PM = 1.45, 4 PM = 0.9, 7 PM = 1.6 and 11 PM = 0.65 Carbohydrate ratio 1:10 done manually but on the pump has 1:8 for lunch and supper and 1:15 for breakfast  Sensitivity 1:50, at 6 PM 1:45 with target 90-100  She continues to use her CGM with the insulin pump Overall still has relatively high blood sugars with A1c consistently 8.1% She may have a hemoglobinopathy and her A1c is usually falsely low unless done from [facility]  Current blood sugar patterns and problems identified are as follows:  Her fasting readings are now relatively better with increasing her nighttime basal rate on the last visit but does show some variability  Also overnight readings appear to be variably high as judged by the CGM but are not consistent  Her blood sugars are generally trending to be higher after about 8 AM until late at night  Was probably readings am moderately increased after breakfast and supper but not as much after lunch  Overall showing significant amount of variability at all times but is tending to be higher in the afternoons and evenings  Details as in  CGM interpretation below  She thinks she can do better with her dietary intake as she is not consistently watching carbohydrates and fats  She is still empirically taking only 1:10 coverage for her carbohydrates and does not have any extended boluses for higher fat meals or snacks  Occasionally may have low normal readings and subsequently will have a rebound from excessive carbohydrates   HYPOGLYCEMIA: None   GLUCOSE readings from fingerstick data:  Mean values apply above for all meters except median for One Touch  PRE-MEAL Fasting Lunch Dinner Bedtime Overall  Glucose range:  131-225     65-373    Mean/median:  172   240   260   214   224+/-84    CONTINUOUS GLUCOSE MONITORING RECORD INTERPRETATION    Dates of Recording:   Sensor  summary:  Average blood glucose for the period of recording is 192+/-66 with average A UC over 140 = 59% and no significant hypoglycemia      Glycemic patterns:  Hyperglycemic episodes = 43 with 25 of them related to bolus with rising since rate of change and less frequently with basal rate decrease and 12 episodes related to rising since rate of change without bolus.  Hypoglycemic episodes none       Overnight periods:   blood sugars  are quite variable although overall higher than normal, probably averaging about 160-170.  Blood sugars are.highest around midnight.  On some days blood sugars are in a good range with only a couple of exceptionally high patterns      Preprandial periods:   blood sugars are generally around 140 on an average at breakfast time with a few days being high.  Lunchtime blood sugars are mostly high and averaging close to 200; had relatively low sugar only once at lunchtime.  Blood sugars around suppertime which is ranging from 6 PM-8 PM meters also around 200 with significant variability  She is not entering carbohydrates in her pump at mealtimes and analysis of blood sugars cannot be done accurately  for mealtime variability        Postprandial periods:   blood sugars are modestly higher after breakfast going up about 50 mg on an average.  Blood sugars after lunch or rising more slowly Since she does not have any specific evening meal blood sugars are mostly rising to a variable extent and continue to be running high around 200 until 1 AM      Hypoglycemia:  none        Wt Readings from Last 3 Encounters:  07/27/15 198 lb 6.4 oz (89.994 kg)  04/27/15 193 lb 9.6 oz (87.816 kg)  01/13/15 192 lb 6.4 oz (87.272 kg)    Lab Results  Component Value Date   HGBA1C 8.1* 07/25/2015   HGBA1C 8.1* 04/27/2015   HGBA1C 5.4 01/11/2015   Lab Results  Component Value Date   MICROALBUR <0.7 04/27/2015   LDLCALC 153* 04/27/2015   CREATININE 0.80 07/25/2015     .  Lab on 07/25/2015  Component Date Value Ref Range Status  . Hgb A1c MFr Bld 07/25/2015 8.1* 4.8 - 5.6 % Final   Comment:          Pre-diabetes: 5.7 - 6.4          Diabetes: >6.4          Glycemic control for adults with diabetes: <7.0   . Est. average glucose Bld gHb Est-m* 07/25/2015 186   Final  . Sodium 07/25/2015 137  135 - 145 mEq/L Final  . Potassium 07/25/2015 4.1  3.5 - 5.1 mEq/L Final  . Chloride 07/25/2015 104  96 - 112 mEq/L Final  . CO2 07/25/2015 26  19 - 32 mEq/L Final  . Glucose, Bld 07/25/2015 163* 70 - 99 mg/dL Final  . BUN 16/10/960412/27/2016 19  6 - 23 mg/dL Final  . Creatinine, Ser 07/25/2015 0.80  0.40 - 1.20 mg/dL Final  . Calcium 54/09/811912/27/2016 9.0  8.4 - 10.5 mg/dL Final  . GFR 14/78/295612/27/2016 82.68  >60.00 mL/min Final      Medication List       This list is accurate as of: 07/27/15  8:27 AM.  Always use your most recent med list.               BAYER CONTOUR NEXT TEST test strip  Generic drug:  glucose blood  USE AS DIRECTED TO TEST BLOOD SUGAR 8 TIMES DAILY     ibuprofen 200 MG tablet  Commonly known as:  ADVIL,MOTRIN  Take 200 mg by mouth every 6 (six) hours as needed for pain (PRN).     levonorgestrel 20 MCG/24HR IUD  Commonly  known as:  MIRENA  1 each by Intrauterine route once.     NOVOLOG 100 UNIT/ML injection  Generic drug:  insulin aspart  USE MAX OF 60 UNITS PER DAY WITH PUMP AS DIRECTED     simvastatin 40 MG tablet  Commonly known as:  ZOCOR  Take 1 tablet (40 mg total) by mouth daily.        Allergies: No Known Allergies  No past medical history on file.  No past surgical history on file.  Family History  Problem Relation Age of Onset  . Hyperlipidemia Mother   . Diabetes Neg Hx   . Heart disease Neg Hx     Social History:  reports that she has never smoked. She has never used smokeless tobacco. Her alcohol and drug histories are not on file.  REVIEW of systems:  She has had hypercholesterolemia and has been on Zocor for a few years, she now says that she sometimes falls asleep at night and forgets to take this  Lab Results  Component Value Date   CHOL 229* 04/27/2015   HDL 65.40 04/27/2015   LDLCALC 153* 04/27/2015   LDLDIRECT 150.5 04/22/2013   TRIG 55.0 04/27/2015   CHOLHDL 4 04/27/2015    EXAM:  BP 124/72 mmHg  Pulse 88  Temp(Src) 97.9 F (36.6 C)  Resp 14  Ht  (1.6 m)  Wt 198 lb 6.4 oz (89.994 kg)  BMI 35.15 kg/m2  SpO2 97%  ASSESSMENT:  Diabetes 1 with inadequate control See history of present illness for detailed discussion of his current management, blood sugar patterns and problems identified Her blood sugars are still poorly controlled overall with A1c 8.1%  Despite using continuous glucose monitoring she has not been able to get consistently better control Her blood sugar trends she will mostly high readings during the day partly related to meal intake and not being consistent on diet especially when not working Overall her blood sugars are not consistently high overnight or fasting   Discussed with the patient that to help with her insulin resistance and possibly with overall control she may try metformin but she is reluctant to add anything at this  time She wants to try doing better with her diet and also exercise more regularly  She will need to make the following changes:  Changed basal rates are as follows: 9 AM = 1.3, 11 AM = 1.45, 1 PM = 1.55 and 7 PM = 1.8, 11 PM = 0.75  Carbohydrate ratio 1:8 at breakfast and dinner  Extended boluses for higher fat meals  Overall try to limit carbohydrate intake  Correction factor 1: 40 during the day   A1c will be checked from [facility]    HYPERCHOLESTEROLEMIA :  She can try taking Zocor in the morning for better compliance needs follow-up level on the next visit   Counseling time on subjects discussed above is over 50% of today's 25 minute visit   Jeanett Antonopoulos 07/27/2015, 8:27 AM

## 2015-08-14 ENCOUNTER — Other Ambulatory Visit: Payer: Self-pay | Admitting: Endocrinology

## 2015-08-22 ENCOUNTER — Other Ambulatory Visit: Payer: Self-pay | Admitting: Endocrinology

## 2015-09-08 ENCOUNTER — Other Ambulatory Visit: Payer: Self-pay | Admitting: Endocrinology

## 2015-10-24 ENCOUNTER — Other Ambulatory Visit: Payer: BC Managed Care – PPO

## 2015-10-27 ENCOUNTER — Ambulatory Visit: Payer: BC Managed Care – PPO | Admitting: Endocrinology

## 2015-12-25 ENCOUNTER — Other Ambulatory Visit: Payer: Self-pay | Admitting: Endocrinology

## 2016-02-02 ENCOUNTER — Other Ambulatory Visit: Payer: Self-pay | Admitting: Endocrinology

## 2016-02-02 NOTE — Telephone Encounter (Signed)
Novolog refill sent to pharmacy with note for appointment for any other refills.

## 2016-03-05 ENCOUNTER — Other Ambulatory Visit: Payer: Self-pay | Admitting: Endocrinology

## 2016-04-01 ENCOUNTER — Other Ambulatory Visit: Payer: Self-pay | Admitting: Endocrinology

## 2016-05-11 ENCOUNTER — Other Ambulatory Visit: Payer: Self-pay | Admitting: Endocrinology

## 2016-06-29 ENCOUNTER — Other Ambulatory Visit: Payer: Self-pay | Admitting: Endocrinology

## 2016-07-19 ENCOUNTER — Other Ambulatory Visit: Payer: Self-pay | Admitting: Endocrinology

## 2016-08-11 ENCOUNTER — Other Ambulatory Visit: Payer: Self-pay | Admitting: Endocrinology

## 2016-08-11 DIAGNOSIS — E1065 Type 1 diabetes mellitus with hyperglycemia: Secondary | ICD-10-CM

## 2016-08-11 DIAGNOSIS — E7800 Pure hypercholesterolemia, unspecified: Secondary | ICD-10-CM

## 2016-08-15 ENCOUNTER — Other Ambulatory Visit: Payer: BC Managed Care – PPO

## 2016-08-23 ENCOUNTER — Ambulatory Visit: Payer: BC Managed Care – PPO | Admitting: Endocrinology

## 2016-09-01 ENCOUNTER — Other Ambulatory Visit: Payer: Self-pay | Admitting: Endocrinology

## 2016-09-04 ENCOUNTER — Other Ambulatory Visit (INDEPENDENT_AMBULATORY_CARE_PROVIDER_SITE_OTHER): Payer: BC Managed Care – PPO

## 2016-09-04 DIAGNOSIS — E1065 Type 1 diabetes mellitus with hyperglycemia: Secondary | ICD-10-CM | POA: Diagnosis not present

## 2016-09-04 DIAGNOSIS — E7800 Pure hypercholesterolemia, unspecified: Secondary | ICD-10-CM | POA: Diagnosis not present

## 2016-09-04 LAB — URINALYSIS, ROUTINE W REFLEX MICROSCOPIC
Bilirubin Urine: NEGATIVE
Ketones, ur: NEGATIVE
Leukocytes, UA: NEGATIVE
Nitrite: NEGATIVE
PH: 6 (ref 5.0–8.0)
Total Protein, Urine: NEGATIVE
UROBILINOGEN UA: 0.2 (ref 0.0–1.0)
Urine Glucose: NEGATIVE
WBC UA: NONE SEEN (ref 0–?)

## 2016-09-04 LAB — COMPREHENSIVE METABOLIC PANEL
ALBUMIN: 4.1 g/dL (ref 3.5–5.2)
ALT: 13 U/L (ref 0–35)
AST: 13 U/L (ref 0–37)
Alkaline Phosphatase: 44 U/L (ref 39–117)
BUN: 15 mg/dL (ref 6–23)
CHLORIDE: 103 meq/L (ref 96–112)
CO2: 27 mEq/L (ref 19–32)
CREATININE: 0.91 mg/dL (ref 0.40–1.20)
Calcium: 9 mg/dL (ref 8.4–10.5)
GFR: 70.9 mL/min (ref >60.00)
GLUCOSE: 192 mg/dL — AB (ref 70–99)
Potassium: 4.8 mEq/L (ref 3.5–5.1)
SODIUM: 135 meq/L (ref 135–145)
Total Bilirubin: 0.5 mg/dL (ref 0.2–1.2)
Total Protein: 6.6 g/dL (ref 6.0–8.3)

## 2016-09-04 LAB — MICROALBUMIN / CREATININE URINE RATIO
Creatinine,U: 49.7 mg/dL
Microalb Creat Ratio: 1.4 mg/g (ref 0.0–30.0)

## 2016-09-04 LAB — LIPID PANEL
CHOLESTEROL: 194 mg/dL (ref 0–200)
HDL: 71.2 mg/dL (ref >39.00)
LDL CALC: 112 mg/dL — AB (ref 0–99)
NonHDL: 122.66
TRIGLYCERIDES: 54 mg/dL (ref 0.0–149.0)
Total CHOL/HDL Ratio: 3
VLDL: 10.8 mg/dL (ref 0.0–40.0)

## 2016-09-04 LAB — HEMOGLOBIN A1C: HEMOGLOBIN A1C: 5.2 % (ref 4.6–6.5)

## 2016-09-09 ENCOUNTER — Other Ambulatory Visit: Payer: Self-pay

## 2016-09-09 ENCOUNTER — Ambulatory Visit (INDEPENDENT_AMBULATORY_CARE_PROVIDER_SITE_OTHER): Payer: BC Managed Care – PPO | Admitting: Endocrinology

## 2016-09-09 VITALS — BP 146/96 | HR 113 | Resp 18 | Wt 193.0 lb

## 2016-09-09 DIAGNOSIS — E1065 Type 1 diabetes mellitus with hyperglycemia: Secondary | ICD-10-CM

## 2016-09-09 DIAGNOSIS — R000 Tachycardia, unspecified: Secondary | ICD-10-CM

## 2016-09-09 DIAGNOSIS — E7800 Pure hypercholesterolemia, unspecified: Secondary | ICD-10-CM

## 2016-09-09 MED ORDER — ROSUVASTATIN CALCIUM 20 MG PO TABS: 20.0000 mg | ORAL_TABLET | Freq: Every day | ORAL | 3 refills | Status: DC

## 2016-09-09 NOTE — Progress Notes (Signed)
Patient ID: Tammy Vega, female   DOB: [birth year], 46 y.o.   MRN: [id number]   Reason for Appointment: Insulin Pump followup:   History of Present Illness   Diagnosis: Type 1 DIABETES MELITUS, date of diagnosis:2003     DIABETES history: She was initially diagnosed with diabetes during pregnancy and required insulin. She went on insulin again in 2004 and was started on the pump in 2005.  Previously her control has been variable with needing periodic adjustments and A1c around 6-7  CURRENT insulin pump:  Medtronic 530 G  HISTORY:     PUMP SETTINGS:  BASAL: Midnight = 0.6, 3 AM = 0.7, 5 AM = 1.7, 7:30 AM = 1.2, 9:30 AM = 1.55, 1 PM = 1.45, 4 PM = 0.8 and 7 PM = 1.8, 11 PM = 0.65  Carbohydrate ratio 1:15 done manually but on the pump has 1:8 for lunch and supper and 1:15 for breakfast  Sensitivity 1:50, at 6 PM 1:45 with target 90-100  She continues to use her CGM with the insulin pump Overall still has relatively high blood sugars  Has not been seen in follow-up for over a year  She may have a hemoglobinopathy and her A1c is usually falsely low unless done from [facility]  Current blood sugar patterns and problems identified are as follows:  Her fasting readings are overall fairly good as judged by her continuous glucose monitoring with some variability  Her overnight blood sugars are quite variable with the suspend event on one occasion  HYPERGLYCEMIA.  This is occurring mostly at mealtimes and is appearing fairly consistent on most days as judged by her continuous glucose monitoring; may be relatively better on some mornings after breakfast based on her carbohydrate intake  BOLUSES: She was previously told to take 1:10 coverage for her lunch and dinner and possibly higher amount but she thinks she is still using only 15 as a factor  Although she does have information readily available on carbohydrate counting she is not entering her  carbohydrates in her pump except once or twice  She also thinks that her blood sugars are high after BREAKFAST even with minimal carbohydrate on some days, she does have a large coffee in the morning  Recently not exercising   HYPOGLYCEMIA: Infrequent; she thinks her blood sugar feels like it is getting low between about 5-6 before supper.  Also has tendency to lower blood sugars just before midnight on some occasion  She is not using additional insulin for higher fat meals and not extending her boluses especially when eating out which may cause overnight hyperglycemia on some days   GLUCOSE readings from fingerstick data:  Checking blood sugars at various times, not always before meals Overall recent AVERAGE glucose 268, previously 224  CONTINUOUS GLUCOSE MONITORING RECORD INTERPRETATION    Dates of Recording: 08/26/16-09/08/16  Sensor  summary:  Average blood glucose for the period of recording is 202+/-66 Her Sensor was recording just under 6 days per week on an average  Hyperglycemic episodes are occurring mostly midmorning, midafternoon and late evening; these are mostly accompanied by basal rate decrease and relatively less frequently with bolus with rising since rate of change.  Overall had 57 hyperglycemic episodes   Hypoglycemic episodes with threshold <70 occurred rarely, with 1 episode around 1 AM and another around 6 PM; these are proceeded by hyperglycemia on 2 episodes and also twice with rapid falling Sensor rate of change  Glycemic patterns:  Blood sugars are mildly increased through the night, has brought peaks of high blood sugars midmorning, midafternoon and in the evenings and more consistently high after lunch and usually after supper Has tendency to blood sugars getting relatively quickly in the low normal range around 6 PM and rarely before midnight and 1 PM      Overnight periods:  Blood sugars show average through the night about 150 with relatively high  readings around midnight and declining gradually until 6 AM with significant variability, blood sugars are in the normal target range only a few times but mostly high with only one transient low sugar at 1 AM      Preprandial periods:  Blood sugars around her breakfast time which is      Postprandial periods:  Difficult to identify since she has not enter carbohydrates but likely to be high on most occasions judged by the timing of her postprandial peaks which are slightly over 200 after breakfast, 220 after lunch and about 260 after supper      Hypoglycemia: Minimal as above        Mean values apply above for all meters except median for One Touch  PRE-MEAL Fasting Lunch Dinner Bedtime Overall  Glucose range:  131-225     65-373    Mean/median:  172   240   260   214   224+/-84      Wt Readings from Last 3 Encounters:  09/09/16 193 lb (87.5 kg)  07/27/15 198 lb 6.4 oz (90 kg)  04/27/15 193 lb 9.6 oz (87.8 kg)    Lab Results  Component Value Date   HGBA1C 5.2 09/04/2016   HGBA1C 8.1 (H) 07/25/2015   HGBA1C 8.1 (H) 04/27/2015   Lab Results  Component Value Date   MICROALBUR <0.7 09/04/2016   LDLCALC 112 (H) 09/04/2016   CREATININE 0.91 09/04/2016     .  Lab on 09/04/2016  Component Date Value Ref Range Status  . Hgb A1c MFr Bld 09/04/2016 5.2  4.6 - 6.5 % Final  . Sodium 09/04/2016 135  135 - 145 mEq/L Final  . Potassium 09/04/2016 4.8  3.5 - 5.1 mEq/L Final  . Chloride 09/04/2016 103  96 - 112 mEq/L Final  . CO2 09/04/2016 27  19 - 32 mEq/L Final  . Glucose, Bld 09/04/2016 192* 70 - 99 mg/dL Final  . BUN 40/98/1191 15  6 - 23 mg/dL Final  . Creatinine, Ser 09/04/2016 0.91  0.40 - 1.20 mg/dL Final  . Total Bilirubin 09/04/2016 0.5  0.2 - 1.2 mg/dL Final  . Alkaline Phosphatase 09/04/2016 44  39 - 117 U/L Final  . AST 09/04/2016 13  0 - 37 U/L Final  . ALT 09/04/2016 13  0 - 35 U/L Final  . Total Protein 09/04/2016 6.6  6.0 - 8.3 g/dL Final  . Albumin 47/82/9562  4.1  3.5 - 5.2 g/dL Final  . Calcium 13/02/6577 9.0  8.4 - 10.5 mg/dL Final  . GFR 46/96/2952 70.90  >60.00 mL/min Final  . Microalb, Ur 09/04/2016 <0.7  0.0 - 1.9 mg/dL Final  . Creatinine,U 84/13/2440 49.7  mg/dL Final  . Microalb Creat Ratio 09/04/2016 1.4  0.0 - 30.0 mg/g Final  . Cholesterol 09/04/2016 194  0 - 200 mg/dL Final  . Triglycerides 09/04/2016 54.0  0.0 - 149.0 mg/dL Final  . HDL 05/25/2535 71.20  >39.00 mg/dL Final  . VLDL 64/40/3474 10.8  0.0 - 40.0 mg/dL Final  . LDL  Cholesterol 09/04/2016 112* 0 - 99 mg/dL Final  . Total CHOL/HDL Ratio 09/04/2016 3   Final  . NonHDL 09/04/2016 122.66   Final  . Color, Urine 09/04/2016 YELLOW  Yellow;Lt. Yellow Final  . APPearance 09/04/2016 CLEAR  Clear Final  . Specific Gravity, Urine 09/04/2016 <=1.005* 1.000 - 1.030 Final  . pH 09/04/2016 6.0  5.0 - 8.0 Final  . Total Protein, Urine 09/04/2016 NEGATIVE  Negative Final  . Urine Glucose 09/04/2016 NEGATIVE  Negative Final  . Ketones, ur 09/04/2016 NEGATIVE  Negative Final  . Bilirubin Urine 09/04/2016 NEGATIVE  Negative Final  . Hgb urine dipstick 09/04/2016 TRACE-INTACT* Negative Final  . Urobilinogen, UA 09/04/2016 0.2  0.0 - 1.0 Final  . Leukocytes, UA 09/04/2016 NEGATIVE  Negative Final  . Nitrite 09/04/2016 NEGATIVE  Negative Final  . WBC, UA 09/04/2016 none seen  0-2/hpf Final  . RBC / HPF 09/04/2016 0-2/hpf  0-2/hpf Final  . Squamous Epithelial / LPF 09/04/2016 Rare(0-4/hpf)  Rare(0-4/hpf) Final    Allergies as of 09/09/2016   No Known Allergies     Medication List       Accurate as of 09/09/16  8:41 PM. Always use your most recent med list.          BAYER CONTOUR NEXT TEST test strip Generic drug:  glucose blood USE AS DIRECTED TO TEST BLOOD SUGAR 8 TIMES DAILY   ibuprofen 200 MG tablet Commonly known as:  ADVIL,MOTRIN Take 200 mg by mouth every 6 (six) hours as needed for pain (PRN).   levonorgestrel 20 MCG/24HR IUD Commonly known as:  MIRENA 1 each by  Intrauterine route once.   NOVOLOG 100 UNIT/ML injection Generic drug:  insulin aspart USE WITH INSULIN PUMP(MAXIMUM OF 60 UNITS PER DAY)   rosuvastatin 20 MG tablet Commonly known as:  CRESTOR Take 1 tablet (20 mg total) by mouth daily.   simvastatin 40 MG tablet Commonly known as:  ZOCOR TAKE 1 TABLET(40 MG) BY MOUTH DAILY       Allergies: No Known Allergies  No past medical history on file.  No past surgical history on file.  Family History  Problem Relation Age of Onset  . Hyperlipidemia Mother   . Diabetes Neg Hx   . Heart disease Neg Hx     Social History:  reports that she has never smoked. She has never used smokeless tobacco. Her alcohol and drug histories are not on file.  REVIEW of systems:  She has had hypercholesterolemia and has been on Zocor for a few years Although LDL is better than before with better compliance and is still over 100   Lab Results  Component Value Date   CHOL 194 09/04/2016   HDL 71.20 09/04/2016   LDLCALC 112 (H) 09/04/2016   LDLDIRECT 150.5 04/22/2013   TRIG 54.0 09/04/2016   CHOLHDL 3 09/04/2016   Eye exam in 2016, due in 6/18  No symptoms of neuropathy in her feet  No recent significant weight loss  Wt Readings from Last 3 Encounters:  09/09/16 193 lb (87.5 kg)  07/27/15 198 lb 6.4 oz (90 kg)  04/27/15 193 lb 9.6 oz (87.8 kg)    EXAM:  BP (!) 146/96 (BP Location: Left Arm, Patient Position: Sitting, Cuff Size: Large)   Pulse (!) 113   Resp 18   Wt 193 lb (87.5 kg)   SpO2 96%   BMI 34.19 kg/m   She appears somewhat anxious  Diabetic Foot Exam - Simple   Simple Foot Form  Diabetic Foot exam was performed with the following findings:  Yes   Visual Inspection No deformities, no ulcerations, no other skin breakdown bilaterally:  Yes Sensation Testing Intact to touch and monofilament testing bilaterally:  Yes Pulse Check Posterior Tibialis and Dorsalis pulse intact bilaterally:  Yes Comments       ASSESSMENT:  Diabetes 1 on insulin pump with inadequate control See history of present illness for detailed discussion of  current management, blood sugar patterns and problems identified  She has not been seen in follow-up for over a year and her blood sugars are still overall about the same Her blood sugars are still poorly controlled overall with A1c 8.1%  Her main difficulty is not getting postprandial blood sugars under control She is not using the recommended carbohydrate ratio 1:10 before lunch and supper and mostly using 1:15 Still does not enter carbohydrates in the pump With high fat meals which occur periodically when she is eating out she is not using extended boluses which causes more significant hyperglycemia lasting longer She does try to bolus to make corrections but even with about 6 boluses a day she still has an average blood sugar of over 200 on the sensor  RECOMMENDATIONS:  Change 7:30 AM basal rate up to 1.35 to help control Dawn phenomenon and rising blood sugar in the morning  Try to bolus at least for 10 g of carbohydrates for a couple of coffee in the morning  Increase basal rate at 1 PM up to 1.55 and start the higher basal of 1.8 at 6:30 PM instead of 7 PM  Reduce basal rate between 4 until 6:30 PM down to 0.7 to avoid tendency to low normal sugars before supper time  CARBOHYDRATE coverage 1:10 at lunch and supper  She must try to enter the carbohydrate for all meals with using the calorie [employer] or other app that she has  Dual boluses with 70/30 ratio for eating out and higher fat meals along with extra 20-40% more insulin for these meals  Restart exercise  She will try to get the Medtronic 670 pump and discussed the advantages of this  More consistent follow-up  A1c will be checked from [facility] on the next visit because of falsely low readings from the current lab.    HYPERCHOLESTEROLEMIA :  She will be switched to Crestor 20 mg for better  efficacy  ?  Increase blood pressure: She is anxious and will need to recheck her blood pressure and also do screening TSH on the next visit for tachycardia  Counseling time on subjects discussed above is over 50% of today's 25 minute visit   Jesstin Studstill 09/09/2016, 8:41 PM

## 2016-09-30 ENCOUNTER — Telehealth: Payer: Self-pay | Admitting: Endocrinology

## 2016-09-30 NOTE — Telephone Encounter (Signed)
Pt called in and requests a refill of her contour test strips sent to the [facility] at [place].

## 2016-10-01 ENCOUNTER — Other Ambulatory Visit: Payer: Self-pay

## 2016-10-01 MED ORDER — GLUCOSE BLOOD VI STRP: ORAL_STRIP | 3 refills | Status: DC

## 2016-10-01 NOTE — Telephone Encounter (Signed)
Ordered

## 2016-10-04 ENCOUNTER — Telehealth: Payer: Self-pay | Admitting: Endocrinology

## 2016-10-04 NOTE — Telephone Encounter (Signed)
Patient need Dr Lucianne MussKumar to override PA in order for patient to get her   glucose blood (BAYER CONTOUR NEXT TEST) test strip     please advise

## 2016-10-04 NOTE — Telephone Encounter (Signed)
The pharmacy is requesting it is [facility]  The pharmacy has informed the pt that the one touch is all that [employer] will cover however this [employer]ayer meter works with the pump she has

## 2016-10-06 ENCOUNTER — Other Ambulatory Visit: Payer: Self-pay | Admitting: Endocrinology

## 2016-10-07 NOTE — Telephone Encounter (Signed)
Please start prior authorization for the contour, medical necessity for using this with her insulin pump.  Otherwise she can continue One Touch and manually enter done readings in the pump

## 2016-10-15 NOTE — Telephone Encounter (Signed)
This has been approved for 1 year starting 10/15/16 and patient has been notified

## 2016-11-15 ENCOUNTER — Other Ambulatory Visit: Payer: BC Managed Care – PPO

## 2016-11-18 ENCOUNTER — Other Ambulatory Visit (INDEPENDENT_AMBULATORY_CARE_PROVIDER_SITE_OTHER): Payer: BC Managed Care – PPO

## 2016-11-18 DIAGNOSIS — E1065 Type 1 diabetes mellitus with hyperglycemia: Secondary | ICD-10-CM

## 2016-11-18 DIAGNOSIS — E7800 Pure hypercholesterolemia, unspecified: Secondary | ICD-10-CM

## 2016-11-18 DIAGNOSIS — R000 Tachycardia, unspecified: Secondary | ICD-10-CM | POA: Diagnosis not present

## 2016-11-18 LAB — LIPID PANEL
CHOL/HDL RATIO: 3
CHOLESTEROL: 194 mg/dL (ref 0–200)
HDL: 63 mg/dL (ref >39.00)
LDL CALC: 105 mg/dL — AB (ref 0–99)
NonHDL: 130.78
TRIGLYCERIDES: 130 mg/dL (ref 0.0–149.0)
VLDL: 26 mg/dL (ref 0.0–40.0)

## 2016-11-18 LAB — COMPREHENSIVE METABOLIC PANEL
ALBUMIN: 4.1 g/dL (ref 3.5–5.2)
ALT: 12 U/L (ref 0–35)
AST: 13 U/L (ref 0–37)
Alkaline Phosphatase: 52 U/L (ref 39–117)
BUN: 15 mg/dL (ref 6–23)
CALCIUM: 9.4 mg/dL (ref 8.4–10.5)
CHLORIDE: 105 meq/L (ref 96–112)
CO2: 26 meq/L (ref 19–32)
Creatinine, Ser: 1 mg/dL (ref 0.40–1.20)
GFR: 63.53 mL/min (ref >60.00)
Glucose, Bld: 210 mg/dL — ABNORMAL HIGH (ref 70–99)
POTASSIUM: 4.5 meq/L (ref 3.5–5.1)
SODIUM: 139 meq/L (ref 135–145)
Total Bilirubin: 0.4 mg/dL (ref 0.2–1.2)
Total Protein: 6.4 g/dL (ref 6.0–8.3)

## 2016-11-18 LAB — TSH: TSH: 3.73 u[IU]/mL (ref 0.35–4.50)

## 2016-11-19 ENCOUNTER — Encounter: Payer: Self-pay | Admitting: Endocrinology

## 2016-11-19 ENCOUNTER — Ambulatory Visit (INDEPENDENT_AMBULATORY_CARE_PROVIDER_SITE_OTHER): Payer: BC Managed Care – PPO | Admitting: Endocrinology

## 2016-11-19 VITALS — BP 148/96 | HR 88 | Ht 63.0 in | Wt 191.0 lb

## 2016-11-19 DIAGNOSIS — E1065 Type 1 diabetes mellitus with hyperglycemia: Secondary | ICD-10-CM

## 2016-11-19 LAB — HEMOGLOBIN A1C
Est. average glucose Bld gHb Est-mCnc: 177 mg/dL
Hgb A1c MFr Bld: 7.8 % — ABNORMAL HIGH (ref 4.8–5.6)

## 2016-11-19 NOTE — Patient Instructions (Signed)
Bolus 10-15 min before meals and enter carbs

## 2016-11-19 NOTE — Progress Notes (Signed)
Patient ID: Tammy Vega, female   DOB: [birth year], 46 y.o.   MRN: [id number]   Reason for Appointment: Insulin Pump followup:   History of Present Illness   Diagnosis: Type 1 DIABETES MELITUS, date of diagnosis:2003     DIABETES history: She was initially diagnosed with diabetes during pregnancy and required insulin. She went on insulin again in 2004 and was started on the pump in 2005.  Previously her control has been variable with needing periodic adjustments and A1c around 6-7 She also tried Symlin 45 g in 2008-9 with some benefit in her postprandial regimen but she stopped since she could not keep up with this  CURRENT insulin pump:  Medtronic 530 G-751     PUMP SETTINGS:  BASAL: Midnight = 0.  575, 3 AM = 0.7, 5 AM = 1.7, 7:30 AM = 1.  35, 9:30 AM = 1.55, 1 PM = 1.5, 4 PM = 0.7 and 7 PM = 1.8, 11 PM = 0.65  Carbohydrate ratio 1: 12-15 done manually but on the pump has 1: 10 for lunch and supper and 1:15 for breakfast  Sensitivity 1:50, at 6 PM 1:45 with target 90-100  She continues to use her CGM with the insulin pump  She may have a hemoglobinopathy and her A1c is usually falsely low unless done from [facility] A1c is now 7.8  Current blood sugar patterns and problems identified are as follows:  Her fasting readings are somewhat variable but averaging about 140  Her overnight blood sugars are somewhat variable with the suspend event occurring twice, once between 5-6 AM  Blood sugars when she checks them in the early morning range between 147-329  HYPERGLYCEMIA.  This is occurring mostly at mealtimes especially after breakfast and lunch  BOLUSES: She was previously told to take 1:10 coverage for her lunch and dinner but she thinks she is still using only 12-15 as a factor.  She frequently does not enter her carbohydrate information in the pump and bolusing and difficult to identify when she is bolusing for the meal  She also thinks she  does not accurately count carbohydrates  The last week she appears to be missing her boluses at suppertime and only taking them later at night when blood sugars are high  She is doing some boluses POSTPRANDIALLY as she forgets to boluses before eating and may do it only when the blood sugars are running higher   HYPOGLYCEMIA: Minimal with most significant episode once around 1 AM and has been able to avoid significant hypoglycemia with the threshold suspend  Blood sugars are generally fairly good around suppertime averaging about 150  Overall recent AVERAGE glucose 253, previously 268 Sensor AVERAGE = 189+/-74    Wt Readings from Last 3 Encounters:  11/19/16 191 lb (86.6 kg)  09/09/16 193 lb (87.5 kg)  07/27/15 198 lb 6.4 oz (90 kg)    Lab Results  Component Value Date   HGBA1C 7.8 (H) 11/18/2016   HGBA1C 5.2 09/04/2016   HGBA1C 8.1 (H) 07/25/2015   Lab Results  Component Value Date   MICROALBUR <0.7 09/04/2016   LDLCALC 105 (H) 11/18/2016   CREATININE 1.00 11/18/2016     .  Lab on 11/18/2016  Component Date Value Ref Range Status  . Hgb A1c MFr Bld 11/18/2016 7.8* 4.8 - 5.6 % Final   Comment:          Pre-diabetes: 5.7 - 6.4  Diabetes: >6.4          Glycemic control for adults with diabetes: <7.0   . Est. average glucose Bld gHb Est-m* 11/18/2016 177  mg/dL Final  . Sodium 16/04/9603 139  135 - 145 mEq/L Final  . Potassium 11/18/2016 4.5  3.5 - 5.1 mEq/L Final  . Chloride 11/18/2016 105  96 - 112 mEq/L Final  . CO2 11/18/2016 26  19 - 32 mEq/L Final  . Glucose, Bld 11/18/2016 210* 70 - 99 mg/dL Final  . BUN 54/03/8118 15  6 - 23 mg/dL Final  . Creatinine, Ser 11/18/2016 1.00  0.40 - 1.20 mg/dL Final  . Total Bilirubin 11/18/2016 0.4  0.2 - 1.2 mg/dL Final  . Alkaline Phosphatase 11/18/2016 52  39 - 117 U/L Final  . AST 11/18/2016 13  0 - 37 U/L Final  . ALT 11/18/2016 12  0 - 35 U/L Final  . Total Protein 11/18/2016 6.4  6.0 - 8.3 g/dL Final  . Albumin  14/78/2956 4.1  3.5 - 5.2 g/dL Final  . Calcium 21/30/8657 9.4  8.4 - 10.5 mg/dL Final  . GFR 84/69/6295 63.53  >60.00 mL/min Final  . Cholesterol 11/18/2016 194  0 - 200 mg/dL Final  . Triglycerides 11/18/2016 130.0  0.0 - 149.0 mg/dL Final  . HDL 28/41/3244 63.00  >39.00 mg/dL Final  . VLDL 07/31/7251 26.0  0.0 - 40.0 mg/dL Final  . LDL Cholesterol 11/18/2016 105* 0 - 99 mg/dL Final  . Total CHOL/HDL Ratio 11/18/2016 3   Final  . NonHDL 11/18/2016 130.78   Final  . TSH 11/18/2016 3.73  0.35 - 4.50 uIU/mL Final    Allergies as of 11/19/2016   No Known Allergies     Medication List       Accurate as of 11/19/16  4:27 PM. Always use your most recent med list.          glucose blood test strip Commonly known as:  BAYER CONTOUR NEXT TEST USE AS DIRECTED TO TEST BLOOD SUGAR 8 TIMES DAILY   ibuprofen 200 MG tablet Commonly known as:  ADVIL,MOTRIN Take 200 mg by mouth every 6 (six) hours as needed for pain (PRN).   levonorgestrel 20 MCG/24HR IUD Commonly known as:  MIRENA 1 each by Intrauterine route once.   NOVOLOG 100 UNIT/ML injection Generic drug:  insulin aspart USE WITH INSULIN PUMP. MAXIMUM OF 60 UNITS PER DAY   rosuvastatin 20 MG tablet Commonly known as:  CRESTOR Take 1 tablet (20 mg total) by mouth daily.       Allergies: No Known Allergies  No past medical history on file.  No past surgical history on file.  Family History  Problem Relation Age of Onset  . Hyperlipidemia Mother   . Diabetes Neg Hx   . Heart disease Neg Hx     Social History:  reports that she has never smoked. She has never used smokeless tobacco. Her alcohol and drug histories are not on file.  REVIEW of systems:  She has had hypercholesterolemia and has been on Zocor for a few years Although LDL is better than before with better compliance and is still over 100   Lab Results  Component Value Date   CHOL 194 11/18/2016   HDL 63.00 11/18/2016   LDLCALC 105 (H) 11/18/2016    LDLDIRECT 150.5 04/22/2013   TRIG 130.0 11/18/2016   CHOLHDL 3 11/18/2016   Eye exam in 2016, due in 6/18  No symptoms of neuropathy in her  feet  No recent significant weight loss  Wt Readings from Last 3 Encounters:  11/19/16 191 lb (86.6 kg)  09/09/16 193 lb (87.5 kg)  07/27/15 198 lb 6.4 oz (90 kg)    EXAM:  BP (!) 148/96   Pulse 88   Ht  (1.6 m)   Wt 191 lb (86.6 kg)   BMI 33.83 kg/m   She appears somewhat anxious  Diabetic Foot Exam - Simple   No data filed       ASSESSMENT:  Diabetes 1 on insulin pump with inadequate control See history of present illness for detailed discussion of  current management, blood sugar patterns and problems identified  Her blood sugars are still poorly controlled overall with A1c7.8, previously 8.1%  She appears to be getting significant postprandial hyperglycemia either from taking inadequate mealtime boluses or not bolusing before she starts eating or even missing her boluses completely at suppertime She is not appearing to be comfortable in counting carbohydrates and is mostly doing arbitrary doses sometimes Still does not enter carbohydrates in the pump frequently However she thinks that whenever she does enter her carbohydrates the blood sugars do better  With high fat meals which occur periodically she has higher blood sugars  RECOMMENDATIONS:  Change 7:30 AM basal rate up to 1.45, may need to start this at 7 AM  Also increase 9:30 AM basal up to 1.70 since her blood sugars tend to be mostly higher around lunchtime as seen on her CGM  She will try to bolus 5-10 minutes at least before she is eating breakfast and lunch  Consider Symlin on the next visit but she is very reluctant to try this now  CARBOHYDRATE coverage 1:10 at lunch and supper with entering carbohydrates and looking up the information  She must try to enter the carbohydrate for all meals   She will probably use the 670 from later this year when  she finishes her current supplies of infusion sets  A1c will be checked from [facility] on the next visit because of falsely low readings from the in-house lab.    HYPERCHOLESTEROLEMIA :  She will be switched to Crestor 20 mg for better efficacy, still not using this and continues to use simvastatin  ?  Increase blood pressure: She is again anxious   Counseling time on subjects discussed above is over 50% of today's 25 minute visit   Kingstyn Deruiter 11/19/2016, 4:27 PM

## 2017-01-09 ENCOUNTER — Other Ambulatory Visit: Payer: Self-pay | Admitting: Endocrinology

## 2017-02-12 ENCOUNTER — Other Ambulatory Visit: Payer: Self-pay | Admitting: Endocrinology

## 2017-02-24 ENCOUNTER — Ambulatory Visit: Payer: BC Managed Care – PPO | Admitting: Endocrinology

## 2017-02-24 ENCOUNTER — Other Ambulatory Visit: Payer: BC Managed Care – PPO

## 2017-02-24 DIAGNOSIS — E1065 Type 1 diabetes mellitus with hyperglycemia: Secondary | ICD-10-CM

## 2017-02-25 LAB — LIPID PANEL
Chol/HDL Ratio: 2.4 ratio (ref 0.0–4.4)
Cholesterol, Total: 176 mg/dL (ref 100–199)
HDL: 74 mg/dL (ref >39)
LDL Calculated: 92 mg/dL (ref 0–99)
Triglycerides: 48 mg/dL (ref 0–149)
VLDL CHOLESTEROL CAL: 10 mg/dL (ref 5–40)

## 2017-02-25 LAB — COMPREHENSIVE METABOLIC PANEL WITH GFR
ALT: 13 IU/L (ref 0–32)
AST: 14 IU/L (ref 0–40)
Albumin/Globulin Ratio: 2.1 (ref 1.2–2.2)
Albumin: 4 g/dL (ref 3.5–5.5)
Alkaline Phosphatase: 50 IU/L (ref 39–117)
BUN/Creatinine Ratio: 23 (ref 9–23)
BUN: 18 mg/dL (ref 6–24)
Bilirubin Total: 0.3 mg/dL (ref 0.0–1.2)
CO2: 23 mmol/L (ref 20–29)
Calcium: 8.8 mg/dL (ref 8.7–10.2)
Chloride: 103 mmol/L (ref 96–106)
Creatinine, Ser: 0.79 mg/dL (ref 0.57–1.00)
GFR calc Af Amer: 105 mL/min/1.73 (ref >59)
GFR calc non Af Amer: 91 mL/min/1.73 (ref >59)
Globulin, Total: 1.9 g/dL (ref 1.5–4.5)
Glucose: 147 mg/dL — ABNORMAL HIGH (ref 65–99)
Potassium: 4.9 mmol/L (ref 3.5–5.2)
Sodium: 139 mmol/L (ref 134–144)
Total Protein: 5.9 g/dL — ABNORMAL LOW (ref 6.0–8.5)

## 2017-02-25 LAB — HEMOGLOBIN A1C
Est. average glucose Bld gHb Est-mCnc: 166 mg/dL
Hgb A1c MFr Bld: 7.4 % — ABNORMAL HIGH (ref 4.8–5.6)

## 2017-02-26 ENCOUNTER — Ambulatory Visit (INDEPENDENT_AMBULATORY_CARE_PROVIDER_SITE_OTHER): Payer: BC Managed Care – PPO | Admitting: Endocrinology

## 2017-02-26 ENCOUNTER — Encounter: Payer: Self-pay | Admitting: Endocrinology

## 2017-02-26 VITALS — BP 144/94 | HR 100 | Ht 63.0 in | Wt 197.6 lb

## 2017-02-26 DIAGNOSIS — E1065 Type 1 diabetes mellitus with hyperglycemia: Secondary | ICD-10-CM

## 2017-02-26 DIAGNOSIS — E7800 Pure hypercholesterolemia, unspecified: Secondary | ICD-10-CM | POA: Diagnosis not present

## 2017-02-26 NOTE — Progress Notes (Signed)
Patient ID: Tammy Vega, female   DOB: Oct 29, 1970, 46 y.o.   MRN: [id number]   Reason for Appointment: Insulin Pump followup:   History of Present Illness   Diagnosis: Type 1 DIABETES MELITUS, date of diagnosis:2003     DIABETES history: She was initially diagnosed with diabetes during pregnancy and required insulin. She went on insulin again in 2004 and was started on the pump in 2005.  Previously her control has been variable with needing periodic adjustments and A1c around 6-7 She also tried Symlin 45 g in 2008-9 with some benefit in her postprandial regimen but she stopped since she could not keep up with this  CURRENT insulin pump:  Medtronic 530 G-751     PUMP SETTINGS:  BASAL: Midnight = 0.  575, 3 AM = 0.7, 5 AM = 1.7, 7:30 AM = 1. 45, 9:30 AM = 1.55, 1 PM = 1.5, 4 PM = 0.7 and 7 PM = 1.8, 11 PM = 0.65  Carbohydrate ratio 1: 10 for lunch and supper and 1:15 for breakfast  Sensitivity 1:50, at 6 PM 1:40 with target 90-100  She continues to use her CGM with the insulin pump  Her A1c is usually falsely low done in the local lab, more accurate done from [facility] A1c is now 7.4  Current blood sugar patterns and problems identified are as follows:  Her fasting readings are overall mildly increased with with fingersticks averaging about 136  She thinks she tends to have her blood sugar tending to drop sometimes around 8 AM if she is not eating breakfast only  Her overnight blood sugars are now consistently trending higher after about 1 AM and averaging about 194 most of the night  HYPERGLYCEMIA.  This is occurring mostly during the night between about 1 AM and 6 AM but also frequently after lunch and dinner   Postprandial readings are periodically over 200 and may stay high for some time, may not always bolus for rising blood sugars   BOLUSES: She is trying to use a 1:10 coverage for lunch and dinner as directed but frequently will have  higher readings postprandially.  This may be because she is tending to eat out frequently and not always getting low fat options  For her higher fat intake she is trying to do do an extended bolus 50% immediate bolus but her blood sugar may arise more quickly with this  Again not entering her CARBOHYDRATES at mealtimes except occasionally  HYPOGLYCEMIA has been minimal and has had a threshold suspend only once in the morning hours  She is not exercising currently and gaining weight  Overall recent AVERAGE glucose 200 1 fingersticks, previously 253 Sensor AVERAGE = 173+/-52, previously 189+/-74    Wt Readings from Last 3 Encounters:  02/26/17 197 lb 9.6 oz (89.6 kg)  11/19/16 191 lb (86.6 kg)  09/09/16 193 lb (87.5 kg)    Lab Results  Component Value Date   HGBA1C 7.4 (H) 02/24/2017   HGBA1C 7.8 (H) 11/18/2016   HGBA1C 5.2 09/04/2016   Lab Results  Component Value Date   MICROALBUR <0.7 09/04/2016   LDLCALC 92 02/24/2017   CREATININE 0.79 02/24/2017     .  Lab on 02/24/2017  Component Date Value Ref Range Status  . Hgb A1c MFr Bld 02/24/2017 7.4* 4.8 - 5.6 % Final   Comment:          Pre-diabetes: 5.7 - 6.4  Diabetes: >6.4          Glycemic control for adults with diabetes: <7.0   . Est. average glucose Bld gHb Est-m* 02/24/2017 166  mg/dL Final  . Glucose 16/10/960407/30/2018 147* 65 - 99 mg/dL Final  . BUN 54/09/811907/30/2018 18  6 - 24 mg/dL Final  . Creatinine, Ser 02/24/2017 0.79  0.57 - 1.00 mg/dL Final  . GFR calc non Af Amer 02/24/2017 91  >59 mL/min/1.73 Final  . GFR calc Af Amer 02/24/2017 105  >59 mL/min/1.73 Final  . BUN/Creatinine Ratio 02/24/2017 23  9 - 23 Final  . Sodium 02/24/2017 139  134 - 144 mmol/L Final  . Potassium 02/24/2017 4.9  3.5 - 5.2 mmol/L Final  . Chloride 02/24/2017 103  96 - 106 mmol/L Final  . CO2 02/24/2017 23  20 - 29 mmol/L Final  . Calcium 02/24/2017 8.8  8.7 - 10.2 mg/dL Final  . Total Protein 02/24/2017 5.9* 6.0 - 8.5 g/dL Final  .  Albumin 14/78/295607/30/2018 4.0  3.5 - 5.5 g/dL Final  . Globulin, Total 02/24/2017 1.9  1.5 - 4.5 g/dL Final  . Albumin/Globulin Ratio 02/24/2017 2.1  1.2 - 2.2 Final  . Bilirubin Total 02/24/2017 0.3  0.0 - 1.2 mg/dL Final  . Alkaline Phosphatase 02/24/2017 50  39 - 117 IU/L Final  . AST 02/24/2017 14  0 - 40 IU/L Final  . ALT 02/24/2017 13  0 - 32 IU/L Final  . Cholesterol, Total 02/24/2017 176  100 - 199 mg/dL Final  . Triglycerides 02/24/2017 48  0 - 149 mg/dL Final  . HDL 21/30/865707/30/2018 74  >39 mg/dL Final  . VLDL Cholesterol Cal 02/24/2017 10  5 - 40 mg/dL Final  . LDL Calculated 02/24/2017 92  0 - 99 mg/dL Final  . Chol/HDL Ratio 02/24/2017 2.4  0.0 - 4.4 ratio Final   Comment:                                   T. Chol/HDL Ratio                                             Men  Women                               1/2 Avg.Risk  3.4    3.3                                   Avg.Risk  5.0    4.4                                2X Avg.Risk  9.6    7.1                                3X Avg.Risk 23.4   11.0     Allergies as of 02/26/2017   No Known Allergies     Medication List       Accurate as of 02/26/17 10:51 AM. Always use your most recent med list.  glucose blood test strip Commonly known as:  BAYER CONTOUR NEXT TEST USE AS DIRECTED TO TEST BLOOD SUGAR 8 TIMES DAILY   ibuprofen 200 MG tablet Commonly known as:  ADVIL,MOTRIN Take 200 mg by mouth every 6 (six) hours as needed for pain (PRN).   levonorgestrel 20 MCG/24HR IUD Commonly known as:  MIRENA 1 each by Intrauterine route once.   NOVOLOG 100 UNIT/ML injection Generic drug:  insulin aspart INJECT A MAXIMUM OF 60 UNITS PER DAY VIA INSULIN PUMP   rosuvastatin 20 MG tablet Commonly known as:  CRESTOR Take 1 tablet (20 mg total) by mouth daily.       Allergies: No Known Allergies  No past medical history on file.  No past surgical history on file.  Family History  Problem Relation Age of Onset  .  Hyperlipidemia Mother   . Diabetes Neg Hx   . Heart disease Neg Hx     Social History:  reports that she has never smoked. She has never used smokeless tobacco. Her alcohol and drug histories are not on file.  REVIEW of systems:  She has had hypercholesterolemia and Had been on Zocor for a few years Now with Crestor her LDL is below 100 No side effects   Lab Results  Component Value Date   CHOL 176 02/24/2017   HDL 74 02/24/2017   LDLCALC 92 02/24/2017   LDLDIRECT 150.5 04/22/2013   TRIG 48 02/24/2017   CHOLHDL 2.4 02/24/2017   Eye exam in 2016, due in 8/18   Has gained weight  Wt Readings from Last 3 Encounters:  02/26/17 197 lb 9.6 oz (89.6 kg)  11/19/16 191 lb (86.6 kg)  09/09/16 193 lb (87.5 kg)    EXAM:  BP (!) 144/94   Pulse 100   Ht 5\' 3"  (1.6 m)   Wt 197 lb 9.6 oz (89.6 kg)   SpO2 98%   BMI 35.00 kg/m   Repeat blood pressure 140/90  ASSESSMENT:  Diabetes 1 on insulin pump with inadequate control See history of present illness for detailed discussion of  current management, blood sugar patterns and problems identified  Her blood sugars are Somewhat better with A1c 7.4 However her blood sugars are still averaging 173 at home  She appears to be getting significant hyperglycemia overnight which is more consistent now Also has postprandial hyperglycemia either from taking inadequate mealtime boluses especially with higher fat meals and eating out frequently Also occasionally may not be bolusing before she starts eating She is not entering her carbohydrates in the pump at mealtimes and not clear what her mealtimes, not able to accurately analyze her postprandial readings also She is tending to gain weight and not exercising   RECOMMENDATIONS:  Change 1 AM basal rate with increase up to 0.8 until 5 AM  Change to 7:30 AM basal rate and she will have a basal rate of 1.4 between 7 AM-9:30 AM  Reduce basal rate at 10 PM down to 0.575  She will try to  bolus more consistently with every meal and bolus before eating usually  She will add at least 20% more insulin for higher fat meals especially with eating out  She must try to enter the carbohydrate for all meals   She will try to look into the 670 pump  Reassured her that she will not have hyperglycemia overnight since she has the threshold suspend feature  A1c will be checked from [facility] because of falsely low readings from the in-house lab.  HYPERCHOLESTEROLEMIA :  LDL at target with Crestor 20 mg  ?  Increase blood pressure: She is again having a higher blood pressure reading because she gets anxious, she will also have her blood pressure checked with her gynecologist and let us know if it is consistently high   Counseling time on subjects discussed above is over 50% of today's 25 minute visit   Taralee Marcus 02/26/2017, 10:51 AM

## 2017-02-27 LAB — HM DIABETES EYE EXAM

## 2017-03-05 ENCOUNTER — Encounter: Payer: Self-pay | Admitting: Endocrinology

## 2017-03-20 ENCOUNTER — Other Ambulatory Visit: Payer: Self-pay | Admitting: Endocrinology

## 2017-03-27 ENCOUNTER — Ambulatory Visit: Payer: BC Managed Care – PPO | Admitting: Endocrinology

## 2017-04-30 ENCOUNTER — Other Ambulatory Visit: Payer: Self-pay | Admitting: Endocrinology

## 2017-05-26 ENCOUNTER — Other Ambulatory Visit: Payer: BC Managed Care – PPO

## 2017-05-29 ENCOUNTER — Ambulatory Visit: Payer: BC Managed Care – PPO | Admitting: Endocrinology

## 2017-06-26 ENCOUNTER — Other Ambulatory Visit: Payer: Self-pay | Admitting: Endocrinology

## 2017-07-25 ENCOUNTER — Other Ambulatory Visit: Payer: Self-pay | Admitting: Endocrinology

## 2017-08-20 ENCOUNTER — Other Ambulatory Visit: Payer: Self-pay | Admitting: Endocrinology

## 2017-10-01 ENCOUNTER — Other Ambulatory Visit: Payer: Self-pay | Admitting: Endocrinology

## 2017-10-16 ENCOUNTER — Other Ambulatory Visit: Payer: Self-pay | Admitting: Endocrinology

## 2017-11-07 ENCOUNTER — Other Ambulatory Visit: Payer: Self-pay | Admitting: Endocrinology

## 2017-11-21 ENCOUNTER — Other Ambulatory Visit: Payer: Self-pay | Admitting: Endocrinology

## 2017-12-15 ENCOUNTER — Other Ambulatory Visit: Payer: Self-pay | Admitting: Endocrinology

## 2017-12-17 ENCOUNTER — Other Ambulatory Visit (INDEPENDENT_AMBULATORY_CARE_PROVIDER_SITE_OTHER): Payer: BC Managed Care – PPO

## 2017-12-17 DIAGNOSIS — E1065 Type 1 diabetes mellitus with hyperglycemia: Secondary | ICD-10-CM | POA: Diagnosis not present

## 2017-12-17 LAB — GLUCOSE, RANDOM: Glucose, Bld: 121 mg/dL — ABNORMAL HIGH (ref 70–99)

## 2017-12-18 LAB — HEMOGLOBIN A1C
ESTIMATED AVERAGE GLUCOSE: 174 mg/dL
HEMOGLOBIN A1C: 7.7 % — AB (ref 4.8–5.6)

## 2017-12-24 NOTE — Progress Notes (Addendum)
Patient ID: Tammy Vega, female   DOB: 02/12/1971, 47 y.o.   MRN: [id number]   Reason for Appointment: Insulin Pump followup:   History of Present Illness   Diagnosis: Type 1 DIABETES MELITUS, date of diagnosis:2003     DIABETES history: She was initially diagnosed with diabetes during pregnancy and required insulin. She went on insulin again in 2004 and was started on the pump in 2005.  Previously her control has been variable with needing periodic adjustments and A1c around 6-7 She also tried Symlin 45 g in 2008-9 with some benefit in her postprandial regimen but she stopped since she could not keep up with this  CURRENT insulin pump:  Medtronic 530 G-751   PUMP SETTINGS:  BASAL: Midnight = 0.  575, 3 AM = 0.7, 5 AM = 1.7, 7:30 AM = 1. 45, 9:30 AM = 1.55, 1 PM = 1.5, 4 PM = 0.7 and 7 PM = 1.8, 11 PM = 0.65  Carbohydrate ratio 1: 10 for lunch and supper and 1:15 for breakfast  Sensitivity 1:50, at 6 PM 1:40 with target 90-100  She continues to use her CGM with the insulin pump although not active all the time  Her A1c is usually falsely low done in the local lab, more accurate done from [facility]. A1c is now 7.4  Current blood sugar patterns and problems identified are as follows:  Recent average blood sugars are higher than on her last visit for the last 2 weeks  HYPERGLYCEMIA.  This is occurring fairly frequently between about 8 AM until noon with significant variability but average about 200+  Also has high sugars between about 2 PM and 5 PM with the peak blood sugar about 240 3:30 PM  Reasons for hyperglycemia: Since urinalysis indicates this is occurring most frequently with bolus with rising sensor rate of change and also rising sensor rate of change without bolus  At breakfast time she appears to have a relatively high reading just before her bolus; also she is bolusing right after eating  She is trying to cover her meals with 1: 15  carb ratio but not sure if she is doing this accurately and her blood sugars are usually going up relatively rapidly after breakfast several times  Also has less obvious hyperglycemia right after lunch but her overall blood sugars are still higher than expected along with relatively high readings right before lunch also  No significant nocturnal hypoglycemia and blood sugars are variably high during the night  FASTING blood sugars on an average on the sensor are relatively good but not consistent   BOLUSES: She is trying to use a 1:10 coverage for dinner but only has one instance where she has used an extended bolus for 1 hour  Again not entering her CARBOHYDRATES at mealtimes except occasionally  HYPOGLYCEMIA has been only occasional and lowest reading was early morning but this was when she did not have her sensor active and had bolused 2 units  Occasionally blood sugar may be low normal after correction boluses but not consistently  Her threshold suspend has been activated only twice in the last 2 weeks and once overnight  She is not exercising currently and gaining weight  Mean values apply above for all meters except median for One Touch  PRE-MEAL  morning Lunch Dinner Bedtime Overall  Glucose range:  39-275    123-284   Mean/median:  197    195  184+/-60  POST-MEAL PC Breakfast PC Lunch PC Dinner  Glucose range:   234-378   Mean/median:         Wt Readings from Last 3 Encounters:  12/25/17 194 lb 9.6 oz (88.3 kg)  02/26/17 197 lb 9.6 oz (89.6 kg)  11/19/16 191 lb (86.6 kg)    Lab Results  Component Value Date   HGBA1C 7.7 (H) 12/17/2017   HGBA1C 7.4 (H) 02/24/2017   HGBA1C 7.8 (H) 11/18/2016   Lab Results  Component Value Date   MICROALBUR <0.7 09/04/2016   LDLCALC 92 02/24/2017   CREATININE 0.79 02/24/2017     .  No visits with results within 1 Week(s) from this visit.  Latest known visit with results is:  Lab on 12/17/2017  Component Date Value Ref  Range Status  . Glucose, Bld 12/17/2017 121* 70 - 99 mg/dL Final  . Hgb U9W MFr Bld 12/17/2017 7.7* 4.8 - 5.6 % Final   Comment:          Prediabetes: 5.7 - 6.4          Diabetes: >6.4          Glycemic control for adults with diabetes: <7.0   . Est. average glucose Bld gHb Est-m* 12/17/2017 174  mg/dL Final    Allergies as of 12/25/2017   No Known Allergies     Medication List        Accurate as of 12/25/17  3:29 PM. Always use your most recent med list.          CONTOUR NEXT TEST test strip Generic drug:  glucose blood TEST BLOOD SUGAR 8 TIMES DAILY AS DIRECTED   ibuprofen 200 MG tablet Commonly known as:  ADVIL,MOTRIN Take 200 mg by mouth every 6 (six) hours as needed for pain (PRN).   levonorgestrel 20 MCG/24HR IUD Commonly known as:  MIRENA 1 each by Intrauterine route once.   NOVOLOG 100 UNIT/ML injection Generic drug:  insulin aspart INJECT A MAXIMUM OF 60 UNITS UNDER THE SKIN VIA INSULIN PUMP AS DIRECTED   rosuvastatin 20 MG tablet Commonly known as:  CRESTOR TAKE 1 TABLET(20 MG) BY MOUTH DAILY       Allergies: No Known Allergies  History reviewed. No pertinent past medical history.  History reviewed. No pertinent surgical history.  Family History  Problem Relation Age of Onset  . Hyperlipidemia Mother   . Diabetes Neg Hx   . Heart disease Neg Hx     Social History:  reports that she has never smoked. She has never used smokeless tobacco. Her alcohol and drug histories are not on file.  REVIEW of systems:  She has had hypercholesterolemia and Had been on Zocor for a few years Now with Crestor her LDL is below 100 No side effects   Lab Results  Component Value Date   CHOL 176 02/24/2017   HDL 74 02/24/2017   LDLCALC 92 02/24/2017   LDLDIRECT 150.5 04/22/2013   TRIG 48 02/24/2017   CHOLHDL 2.4 02/24/2017    Eye exam in done in 02/2017  ?  HYPERTENSION: Her blood pressure is consistently high in the office She has not checked at home  and has not had a reading done at another doctor's office including gynecologist for a couple of years She tends to get anxious in the office along with a fast pulse  BP Readings from Last 3 Encounters:  12/25/17 (!) 160/110  02/26/17 (!) 144/94  11/19/16 (!) 148/96     EXAM:  BP (!) 160/110 (BP Location: Left Arm, Patient Position: Sitting, Cuff Size: Normal)   Pulse (!) 120   Ht  (1.6 m)   Wt 194 lb 9.6 oz (88.3 kg)   SpO2 99%   BMI 34.47 kg/m   Repeat blood pressure 140/90  ASSESSMENT:  Diabetes 1 on insulin pump with inadequate control See history of present illness for detailed discussion of  current management, blood sugar patterns and problems identified  Her blood sugars are still difficult to control A1c 7.7 However overall blood sugar average is still quite high Most of her hypoglycemia is postprandial after breakfast and lunch    RECOMMENDATIONS:  No changes in basal rate for now, may need to increase it before lunch  Again emphasized the need to enter her carbohydrates whenever she is bolusing for meals  To try and check the blood sugar before the meal and enter in the pump especially at lunchtime consistently  She will try to bolus for breakfast before she goes down to the kitchen so she has less immediate hypogl hyperglycemia  To bolus an extra unit BOLUS for coffee in the morning  Use 1: 10 coverage for all meals including breakfast and more for higher fat meals  Change NovoLog to Fiasp for better early postprandial control, may still need to bolus a few minutes before eating  She will check with her insurance or supplier about the 670 pump and if affordable order this  Call if having persistently high readings before meals  More regular follow-up  Check microalbumin today A1c will be checked always from [facility] because of falsely low readings from the in-house lab.    HYPERCHOLESTEROLEMIA : To have lipids checked on her next  labs  HYPERTENSION:  Blood pressure is higher today than before and also nearly the same towards the end of the exam today She likely has mild hypertension but unclear how much of this is whitecoat syndrome as no independent blood pressure readings available Will need to be having her check her blood pressure at home with an Omron meter In the meantime start ramipril 2.5 mg daily, discussed benefits including renal and possible side effects  Short-term follow-up in 1 month for blood pressure  Counseling time on subjects discussed in assessment and plan sections is over 50% of today's 25 minute visit   Reather Littler 12/25/2017, 3:29 PM

## 2017-12-25 ENCOUNTER — Other Ambulatory Visit: Payer: Self-pay

## 2017-12-25 ENCOUNTER — Ambulatory Visit: Payer: BC Managed Care – PPO | Admitting: Endocrinology

## 2017-12-25 ENCOUNTER — Encounter: Payer: Self-pay | Admitting: Endocrinology

## 2017-12-25 VITALS — BP 160/100 | HR 120 | Ht 63.0 in | Wt 194.6 lb

## 2017-12-25 DIAGNOSIS — E1065 Type 1 diabetes mellitus with hyperglycemia: Secondary | ICD-10-CM | POA: Diagnosis not present

## 2017-12-25 DIAGNOSIS — R000 Tachycardia, unspecified: Secondary | ICD-10-CM

## 2017-12-25 DIAGNOSIS — E7800 Pure hypercholesterolemia, unspecified: Secondary | ICD-10-CM | POA: Diagnosis not present

## 2017-12-25 LAB — MICROALBUMIN / CREATININE URINE RATIO
Creatinine,U: 31.2 mg/dL
MICROALB/CREAT RATIO: 2.2 mg/g (ref 0.0–30.0)
Microalb, Ur: <0.7 mg/dL (ref 0.0–1.9)

## 2017-12-25 MED ORDER — RAMIPRIL 2.5 MG PO CAPS: 2.5000 mg | ORAL_CAPSULE | Freq: Every day | ORAL | 1 refills | Status: DC

## 2017-12-25 MED ORDER — INSULIN ASPART (W/NIACINAMIDE) 100 UNIT/ML ~~LOC~~ SOLN: 60.0000 [IU] | Freq: Every day | SUBCUTANEOUS | 3 refills | Status: DC

## 2017-12-25 NOTE — Addendum Note (Signed)
Addended by: Adline Mango I on: 12/25/2017 04:05 PM   Modules accepted: Orders

## 2017-12-25 NOTE — Addendum Note (Signed)
Addended by: Reather Littler on: 12/25/2017 09:09 PM   Modules accepted: Orders

## 2017-12-25 NOTE — Patient Instructions (Addendum)
Take am bolus 10-15 min before Bfst  1:10 ratio for all meals  Omron brand BP meter

## 2017-12-30 ENCOUNTER — Telehealth: Payer: Self-pay

## 2017-12-30 NOTE — Telephone Encounter (Signed)
PA submitted via CoverMyMeds for Contour Next Test Strips Testing 8 times daily.  Key: [id number]

## 2018-01-06 NOTE — Telephone Encounter (Signed)
Received fax from [facility] [employer] stating that patient has been approved for Contour Next Test Strips as long as pt remains covered under by the [facility] and there is no changes to pt's plan benefits. This approval is good from 01/06/2018 until 01/07/2019

## 2018-01-17 ENCOUNTER — Other Ambulatory Visit: Payer: Self-pay | Admitting: Endocrinology

## 2018-02-09 ENCOUNTER — Other Ambulatory Visit: Payer: Self-pay | Admitting: Endocrinology

## 2018-02-11 ENCOUNTER — Other Ambulatory Visit (INDEPENDENT_AMBULATORY_CARE_PROVIDER_SITE_OTHER): Payer: BC Managed Care – PPO

## 2018-02-11 DIAGNOSIS — E7800 Pure hypercholesterolemia, unspecified: Secondary | ICD-10-CM

## 2018-02-11 DIAGNOSIS — E1065 Type 1 diabetes mellitus with hyperglycemia: Secondary | ICD-10-CM

## 2018-02-11 DIAGNOSIS — R000 Tachycardia, unspecified: Secondary | ICD-10-CM

## 2018-02-11 LAB — LIPID PANEL
Cholesterol: 166 mg/dL (ref 0–200)
HDL: 60.8 mg/dL (ref >39.00)
LDL Cholesterol: 90 mg/dL (ref 0–99)
NonHDL: 105
Total CHOL/HDL Ratio: 3
Triglycerides: 77 mg/dL (ref 0.0–149.0)
VLDL: 15.4 mg/dL (ref 0.0–40.0)

## 2018-02-11 LAB — BASIC METABOLIC PANEL
BUN: 11 mg/dL (ref 6–23)
CALCIUM: 8.3 mg/dL — AB (ref 8.4–10.5)
CO2: 26 meq/L (ref 19–32)
Chloride: 104 mEq/L (ref 96–112)
Creatinine, Ser: 0.99 mg/dL (ref 0.40–1.20)
GFR: 63.92 mL/min (ref >60.00)
GLUCOSE: 255 mg/dL — AB (ref 70–99)
Potassium: 4.1 mEq/L (ref 3.5–5.1)
Sodium: 138 mEq/L (ref 135–145)

## 2018-02-11 LAB — TSH: TSH: 3.67 u[IU]/mL (ref 0.35–4.50)

## 2018-02-13 ENCOUNTER — Encounter: Payer: Self-pay | Admitting: Endocrinology

## 2018-02-13 ENCOUNTER — Ambulatory Visit: Payer: BC Managed Care – PPO | Admitting: Endocrinology

## 2018-02-13 VITALS — BP 142/80 | HR 112 | Ht 63.0 in | Wt 196.4 lb

## 2018-02-13 DIAGNOSIS — E1065 Type 1 diabetes mellitus with hyperglycemia: Secondary | ICD-10-CM

## 2018-02-13 DIAGNOSIS — E7800 Pure hypercholesterolemia, unspecified: Secondary | ICD-10-CM

## 2018-02-13 NOTE — Progress Notes (Signed)
Patient ID: Tammy Vega, female   DOB: 09-23-1970, 47 y.o.   MRN: [id number]   Reason for Appointment: Insulin Pump followup:   History of Present Illness   Diagnosis: Type 1 DIABETES MELITUS, date of diagnosis:2003     DIABETES history: She was initially diagnosed with diabetes during pregnancy and required insulin. She went on insulin again in 2004 and was started on the pump in 2005.  Previously her control has been variable with needing periodic adjustments and A1c around 6-7 She also tried Symlin 45 g in 2008-9 with some benefit in her postprandial regimen but she stopped since she could not keep up with this  CURRENT insulin pump:  Medtronic 530 G-[id number]   PUMP SETTINGS:  BASAL: Midnight = 0.  575, 3 AM = 0.7, 5 AM = 1.7, 7:30 AM = 1. 45, 9:30 AM = 1.55, 1 PM = 1.5, 4 PM = 0.7 and 7 PM = 1.8, 11 PM = 0.65  Carbohydrate ratio 1: 10 for lunch and supper and 1:15 for breakfast  Sensitivity 1:50, at 6 PM 1:40 with target 90-100  Her A1c is usually falsely low done in the local lab, more accurate done from [facility]  A1c is now 7.4 compared to previous 7.4  Current blood sugar patterns and problems identified are as follows:  In 5/19 she was switched from NovoLog to [place] especially since she was tending to have postprandial hyperglycemia and not bolusing before eating consistently  She still appears to have postprandial hyperglycemia but not as consistently  However difficult to assess her meal patterns as she does not enter carbohydrates at her meals again  Even with her CARBOHYDRATE ratio being programmed in the pump she is probably just using 1: 10 factor for her carbohydrate coverage  Overall blood sugars on average are not any better  HYPERGLYCEMIA: Most consistently occurring between about 11 PM until 5 AM  According to her pump analysis hyperglycemia is most frequentlyafter her basal rate decrease  Also has a significant rise in  blood sugar after meals but not consistently after lunch or supper  As before she is not checking her blood sugar right at the mealtime bolus to enable correction  HYPOGLYCEMIA this has been minimal but her blood sugars are low normal frequently around 7-8 AM and she may have feel hypoglycemic at that time, appears to have progressive drop in blood sugar after about 6 AM until her breakfast time  She does tend to do corrections for high readings during the night  Mean values apply above for all meters except median for One Touch  PRE-MEAL Fasting Lunch Dinner Bedtime Overall  Glucose range:  59-242    168-275   Mean/median: -130   127  191   POST-MEAL PC Breakfast PC Lunch PC Dinner  Glucose range:     Mean/median:      Previous data:  PRE-MEAL  morning Lunch Dinner Bedtime Overall  Glucose range:  39-275    123-284   Mean/median:  197    195  184+/-60   POST-MEAL PC Breakfast PC Lunch PC Dinner  Glucose range:   234-378   Mean/median:         Wt Readings from Last 3 Encounters:  02/13/18 196 lb 6.4 oz (89.1 kg)  12/25/17 194 lb 9.6 oz (88.3 kg)  02/26/17 197 lb 9.6 oz (89.6 kg)    Lab Results  Component Value Date   HGBA1C 7.7 (  H) 12/17/2017   HGBA1C 7.4 (H) 02/24/2017   HGBA1C 7.8 (H) 11/18/2016   Lab Results  Component Value Date   MICROALBUR <0.7 12/25/2017   LDLCALC 90 02/11/2018   CREATININE 0.99 02/11/2018     .  Lab on 02/11/2018  Component Date Value Ref Range Status  . TSH 02/11/2018 3.67  0.35 - 4.50 uIU/mL Final  . Sodium 02/11/2018 138  135 - 145 mEq/L Final  . Potassium 02/11/2018 4.1  3.5 - 5.1 mEq/L Final  . Chloride 02/11/2018 104  96 - 112 mEq/L Final  . CO2 02/11/2018 26  19 - 32 mEq/L Final  . Glucose, Bld 02/11/2018 255* 70 - 99 mg/dL Final  . BUN 78/29/5621 11  6 - 23 mg/dL Final  . Creatinine, Ser 02/11/2018 0.99  0.40 - 1.20 mg/dL Final  . Calcium 30/86/5784 8.3* 8.4 - 10.5 mg/dL Final  . GFR 69/62/9528 63.92  >60.00 mL/min Final    . Cholesterol 02/11/2018 166  0 - 200 mg/dL Final   ATP III Classification       Desirable:  < 200 mg/dL               Borderline High:  200 - 239 mg/dL          High:  > = 413 mg/dL  . Triglycerides 02/11/2018 77.0  0.0 - 149.0 mg/dL Final   Normal:  <244 mg/dLBorderline High:  150 - 199 mg/dL  . HDL 02/11/2018 60.80  >39.00 mg/dL Final  . VLDL 07/31/7251 15.4  0.0 - 40.0 mg/dL Final  . LDL Cholesterol 02/11/2018 90  0 - 99 mg/dL Final  . Total CHOL/HDL Ratio 02/11/2018 3   Final                  Men          Women1/2 Average Risk     3.4          3.3Average Risk          5.0          4.42X Average Risk          9.6          7.13X Average Risk          15.0          11.0                      . NonHDL 02/11/2018 105.00   Final   NOTE:  Non-HDL goal should be 30 mg/dL higher than patient's LDL goal (i.e. LDL goal of < 70 mg/dL, would have non-HDL goal of < 100 mg/dL)    Allergies as of 6/64/4034   No Known Allergies     Medication List        Accurate as of 02/13/18  8:21 AM. Always use your most recent med list.          CONTOUR NEXT TEST test strip Generic drug:  glucose blood TEST BLOOD SUGAR 8 TIMES DAILY AS DIRECTED   ibuprofen 200 MG tablet Commonly known as:  ADVIL,MOTRIN Take 200 mg by mouth every 6 (six) hours as needed for pain (PRN).   Insulin Aspart (w/Niacinamide) 100 UNIT/ML Soln Commonly known as:  FIASP Inject 60 Units into the skin daily. Inject a maximum of 60 units under the skin via insulin pump as directed.   levonorgestrel 20 MCG/24HR IUD Commonly known as:  MIRENA 1 each by Intrauterine route  once.   ramipril 2.5 MG capsule Commonly known as:  ALTACE TAKE 1 CAPSULE (2.5 MG TOTAL) BY MOUTH DAILY.   rosuvastatin 20 MG tablet Commonly known as:  CRESTOR TAKE 1 TABLET(20 MG) BY MOUTH DAILY       Allergies: No Known Allergies  History reviewed. No pertinent past medical history.  History reviewed. No pertinent surgical history.  Family  History  Problem Relation Age of Onset  . Hyperlipidemia Mother   . Diabetes Neg Hx   . Heart disease Neg Hx     Social History:  reports that she has never smoked. She has never used smokeless tobacco. Her alcohol and drug histories are not on file.  REVIEW of systems:  She has had hypercholesterolemia and Had been on Zocor for a few years With Crestor her LDL is below 100 No side effects   Lab Results  Component Value Date   CHOL 166 02/11/2018   HDL 60.80 02/11/2018   LDLCALC 90 02/11/2018   LDLDIRECT 150.5 04/22/2013   TRIG 77.0 02/11/2018   CHOLHDL 3 02/11/2018    Eye exam in done in 02/2017   HYPERTENSION: Her blood pressure  had been consistently high in the office She has not checked at home even with reminders  She tends to get anxious in the office along with a fast pulse However with starting ramipril 2.5 mg daily her blood pressure appears to be better    BP Readings from Last 3 Encounters:  02/13/18 (!) 142/80  12/25/17 (!) 160/100  02/26/17 (!) 144/94     EXAM:  BP (!) 142/80 (BP Location: Left Arm, Patient Position: Sitting, Cuff Size: Normal)   Pulse (!) 112   Ht 5\' 3"  (1.6 m)   Wt 196 lb 6.4 oz (89.1 kg)   SpO2 98%   BMI 34.79 kg/m     ASSESSMENT:  Diabetes 1 on insulin pump with inadequate control  See history of present illness for detailed discussion of  current management, blood sugar patterns and problems identified  Her blood sugars are still difficult to control consistently  A1c 7.7  She has higher nocturnal readings as seen on her sensor Also has frequently high postprandial readings but may not be consistent She is still not entering carbohydrates or bolusing before eating consistently as directed Also discussed that she needs to do better with entering her blood sugar at the time of the pre-meal bolus which she is not doing  On waking up currently does tend to have low normal or low sugars  fasting    RECOMMENDATIONS:  Basal rates will be changed as follows  Midnight = 0.65, 1 AM = 0.9, 5 AM = 1.6, 9:30 AM = 1.8 and 10 PM = 0.625  She will try to get the new Medtronic pump if possible soon  1: 8 carbohydrate coverage for breakfast and supper and continue 1: 10 for lunch, she will need to use these either manually or with entering carbohydrates  She needs to enter blood sugars at the time of the bolus to enable correction as she is not doing this now  Continue to use extended boluses for higher fat meals  She will try to work more on trying to bolus before eating including at breakfast  Continue Fiasp  Call if having any consistent amount of hypoglycemia  Regular exercise  Follow-up in December   A1c will be checked always from [facility] because of falsely low readings from the in-house lab.  HYPERCHOLESTEROLEMIA : To continue Crestor and follow-up lipids annually   HYPERTENSION: She has responded to enalapril with lower blood pressure readings and is tolerating this well We will continue this for her mild hypertension and long-term renal benefits Also would be desirable for her to monitor blood pressure readings at home   Counseling time on subjects discussed in assessment and plan sections is over 50% of today's 25 minute visit   Reather LittlerAjay Keene Gilkey 02/13/2018, 8:21 AM

## 2018-02-18 ENCOUNTER — Other Ambulatory Visit: Payer: Self-pay | Admitting: Endocrinology

## 2018-02-27 ENCOUNTER — Other Ambulatory Visit: Payer: Self-pay | Admitting: Endocrinology

## 2018-02-27 ENCOUNTER — Telehealth: Payer: Self-pay

## 2018-02-27 ENCOUNTER — Other Ambulatory Visit: Payer: Self-pay

## 2018-02-27 MED ORDER — INSULIN GLARGINE 100 UNIT/ML ~~LOC~~ SOLN: 12.0000 [IU] | Freq: Every day | SUBCUTANEOUS | 0 refills | Status: DC

## 2018-02-27 NOTE — Telephone Encounter (Signed)
She will need to take Lantus insulin with a syringe if she does not have we will need a prescription, she can take 12 units now.  She will take NovoLog with a syringe before each meal and for high sugars based on the calculation from her pump as usual

## 2018-02-27 NOTE — Telephone Encounter (Signed)
Pt called and stated that her insulin pump has quit working and she contacted [employer] and they are sending her a new pump and she will have it by 12 tomorrow but she would like orders on how much insulin to give herself until this new pump comes in.

## 2018-02-27 NOTE — Telephone Encounter (Signed)
Called pt and gave her MD message. Pt verbalized understanding. 

## 2018-03-02 ENCOUNTER — Telehealth: Payer: Self-pay | Admitting: Endocrinology

## 2018-03-02 NOTE — Telephone Encounter (Signed)
Tammy Vega is calling to check status of paper work that was refaxed on 7/31.  Detailed written order for pump CGM and test strips Please advise  Fax- [phone] Phone-[phone] REF#[id number]

## 2018-03-02 NOTE — Telephone Encounter (Signed)
Paperwork is with MD.  

## 2018-03-10 ENCOUNTER — Encounter: Payer: Self-pay | Admitting: Endocrinology

## 2018-03-10 LAB — HM DIABETES EYE EXAM

## 2018-03-19 ENCOUNTER — Other Ambulatory Visit: Payer: Self-pay | Admitting: Endocrinology

## 2018-03-21 ENCOUNTER — Other Ambulatory Visit: Payer: Self-pay | Admitting: Endocrinology

## 2018-04-26 ENCOUNTER — Other Ambulatory Visit: Payer: Self-pay | Admitting: Endocrinology

## 2018-04-30 ENCOUNTER — Other Ambulatory Visit: Payer: Self-pay | Admitting: Endocrinology

## 2018-05-25 ENCOUNTER — Other Ambulatory Visit: Payer: Self-pay | Admitting: Endocrinology

## 2018-06-28 ENCOUNTER — Other Ambulatory Visit: Payer: Self-pay | Admitting: Endocrinology

## 2018-07-02 ENCOUNTER — Telehealth: Payer: Self-pay | Admitting: Endocrinology

## 2018-07-02 ENCOUNTER — Other Ambulatory Visit: Payer: Self-pay

## 2018-07-02 MED ORDER — ROSUVASTATIN CALCIUM 20 MG PO TABS: ORAL_TABLET | ORAL | 1 refills | Status: DC

## 2018-07-02 NOTE — Telephone Encounter (Signed)
This has been sent

## 2018-07-02 NOTE — Telephone Encounter (Signed)
MEDICATION: rosuvastatin (CRESTOR) 20 MG tablet  PHARMACY:  [id number] - [place],   IS THIS A 90 DAY SUPPLY : Yes  IS PATIENT OUT OF MEDICATION: Couple days left  IF NOT; HOW MUCH IS LEFT:   LAST APPOINTMENT DATE: @12 /07/2017  NEXT APPOINTMENT DATE:@12 /12/2017  DO WE HAVE YOUR PERMISSION TO LEAVE A DETAILED MESSAGE: Yes  OTHER COMMENTS:    **Let patient know to contact pharmacy at the end of the day to make sure medication is ready. **  ** Please notify patient to allow 48-72 hours to process**  **Encourage patient to contact the pharmacy for refills or they can request refills through [facility]**

## 2018-07-03 ENCOUNTER — Other Ambulatory Visit (INDEPENDENT_AMBULATORY_CARE_PROVIDER_SITE_OTHER): Payer: BC Managed Care – PPO

## 2018-07-03 DIAGNOSIS — E1065 Type 1 diabetes mellitus with hyperglycemia: Secondary | ICD-10-CM | POA: Diagnosis not present

## 2018-07-03 LAB — BASIC METABOLIC PANEL
BUN: 15 mg/dL (ref 6–23)
CHLORIDE: 103 meq/L (ref 96–112)
CO2: 26 mEq/L (ref 19–32)
Calcium: 8.7 mg/dL (ref 8.4–10.5)
Creatinine, Ser: 0.9 mg/dL (ref 0.40–1.20)
GFR: 71.23 mL/min (ref >60.00)
Glucose, Bld: 173 mg/dL — ABNORMAL HIGH (ref 70–99)
POTASSIUM: 4.6 meq/L (ref 3.5–5.1)
Sodium: 136 mEq/L (ref 135–145)

## 2018-07-03 LAB — HEMOGLOBIN A1C: HEMOGLOBIN A1C: 5 % (ref 4.6–6.5)

## 2018-07-08 ENCOUNTER — Encounter: Payer: Self-pay | Admitting: Endocrinology

## 2018-07-08 ENCOUNTER — Ambulatory Visit: Payer: BC Managed Care – PPO | Admitting: Endocrinology

## 2018-07-08 VITALS — BP 130/70 | HR 100 | Ht 63.0 in | Wt 194.0 lb

## 2018-07-08 DIAGNOSIS — E7800 Pure hypercholesterolemia, unspecified: Secondary | ICD-10-CM

## 2018-07-08 DIAGNOSIS — E1065 Type 1 diabetes mellitus with hyperglycemia: Secondary | ICD-10-CM | POA: Diagnosis not present

## 2018-07-08 NOTE — Progress Notes (Signed)
Patient ID: Tammy Vega, female   DOB: 1971-07-18, 47 y.o.   MRN: [id number]   Reason for Appointment: Insulin Pump followup:   History of Present Illness   Diagnosis: Type 1 DIABETES MELITUS, date of diagnosis:2003     DIABETES history: She was initially diagnosed with diabetes during pregnancy and required insulin. She went on insulin again in 2004 and was started on the pump in 2005.  Previously her control has been variable with needing periodic adjustments and A1c around 6-7 She also tried Symlin 45 g in 2008-9 with some benefit in her postprandial regimen but she stopped since she could not keep up with this  CURRENT insulin pump:  Medtronic 670   PUMP SETTINGS:  BASAL: Midnight = 0.  575, 3 AM = 0.7, 5 AM = 1.7, 7:30 AM = 1. 45, 9:30 AM = 1.55, 1 PM = 1.5, 4 PM = 0.7 and 7 PM = 1.8, 11 PM = 0.65  Carbohydrate ratio 1: 10 for lunch and supper and 1:15 for breakfast  Sensitivity 1:50, at 6 PM 1:40 with target 90-100  Her A1c is falsely low done in the local lab, more accurate done from [facility]  A1c is currently pending  Current blood sugar patterns and problems identified are as follows:  In 8/19 she was started on the 670 pump and has been in the auto mode subsequently  Blood sugar patterns are discussed below  Although sugars are overall lower she still has variable amounts of postprandial hyperglycemia  Only able to keep her sensor on 77% of the time recently auto mode about 83%  She is not always calibrating or switching her sensor on time and will get into the manual mode  Not enough data available to suggest a change in basal rate programming to be done for the manual mode   CONTINUOUS GLUCOSE MONITORING RECORD INTERPRETATION    Dates of Recording: 2 weeks  Sensor description: Guardian  Results statistics:   CGM use % of time 77  Average and SD 166  Time in range       63 %  % Time Above 180 28  % Time above 250 8    % Time Below target 1    Glycemic patterns summary: Blood sugars are the best before 7 AM and then modestly higher with some variability the rest of the day averaging between 160-200 with no consistent high patterns  Hyperglycemic episodes occurred 3 times, 3-5 PM, 8 AM-12 noon and once after 9 PM-  Hypoglycemic episodes have been minimal with transient low normal sugars  Overnight periods: Blood sugars are stable with gradual decline in midnight and 5-6 AM starting off an average of about 180  Preprandial periods: Blood sugars are rising somewhat for breakfast averaging about 160 but as low as 100 Blood sugars are excellent before lunchtime most of the time before dinner blood sugars are variably high but not consistent  Postprandial periods: After breakfast:   Blood sugars are mostly rising fairly quickly After lunch: Blood sugars are very well controlled about 1 hour later most of the time but trending to be significantly higher after 3 hours  After dinner: Blood sugars generally modest with only occasional high readings once low normal reading at 3 hours   PRE-MEAL  breakfast Lunch Dinner Bedtime Overall  Glucose range:  100-205      Mean/median:  165  142  176  POST-MEAL PC Breakfast PC Lunch PC Dinner  Glucose range:     Mean/median:  197  194 1 67   Previous reading  Mean values apply above for all meters except median for One Touch  PRE-MEAL Fasting Lunch Dinner Bedtime Overall  Glucose range:  59-242    168-275   Mean/median: 130  194  127  191   POST-MEAL PC Breakfast PC Lunch PC Dinner  Glucose range:     Mean/median:          Wt Readings from Last 3 Encounters:  07/08/18 194 lb (88 kg)  02/13/18 196 lb 6.4 oz (89.1 kg)  12/25/17 194 lb 9.6 oz (88.3 kg)    Lab Results  Component Value Date   HGBA1C 5.0 07/03/2018   HGBA1C 7.7 (H) 12/17/2017   HGBA1C 7.4 (H) 02/24/2017   Lab Results  Component Value Date   MICROALBUR <0.7 12/25/2017   LDLCALC  90 02/11/2018   CREATININE 0.90 07/03/2018     .  Lab on 07/03/2018  Component Date Value Ref Range Status  . Sodium 07/03/2018 136  135 - 145 mEq/L Final  . Potassium 07/03/2018 4.6  3.5 - 5.1 mEq/L Final  . Chloride 07/03/2018 103  96 - 112 mEq/L Final  . CO2 07/03/2018 26  19 - 32 mEq/L Final  . Glucose, Bld 07/03/2018 173* 70 - 99 mg/dL Final  . BUN 16/04/9603 15  6 - 23 mg/dL Final  . Creatinine, Ser 07/03/2018 0.90  0.40 - 1.20 mg/dL Final  . Calcium 54/03/8118 8.7  8.4 - 10.5 mg/dL Final  . GFR 14/78/2956 71.23  >60.00 mL/min Final  . Hgb A1c MFr Bld 07/03/2018 5.0  4.6 - 6.5 % Final   Glycemic Control Guidelines for People with Diabetes:Non Diabetic:  <6%Goal of Therapy: <7%Additional Action Suggested:  >8%     Allergies as of 07/08/2018   No Known Allergies     Medication List        Accurate as of 07/08/18  8:32 AM. Always use your most recent med list.          CONTOUR NEXT TEST test strip Generic drug:  glucose blood TEST BLOOD SUGAR 8 TIMES DAILY AS DIRECTED   FIASP 100 UNIT/ML Soln Generic drug:  Insulin Aspart (w/Niacinamide) INJECT A MAXIMUM OF 60 UNITS UNDER THE SKIN VIA INSULIN PUMP AS DIRECTED.   ibuprofen 200 MG tablet Commonly known as:  ADVIL,MOTRIN Take 200 mg by mouth every 6 (six) hours as needed for pain (PRN).   levonorgestrel 20 MCG/24HR IUD Commonly known as:  MIRENA 1 each by Intrauterine route once.   ramipril 2.5 MG capsule Commonly known as:  ALTACE TAKE 1 CAPSULE (2.5 MG TOTAL) BY MOUTH DAILY.   rosuvastatin 20 MG tablet Commonly known as:  CRESTOR TAKE 1 TABLET(20 MG) BY MOUTH DAILY       Allergies: No Known Allergies  History reviewed. No pertinent past medical history.  History reviewed. No pertinent surgical history.  Family History  Problem Relation Age of Onset  . Hyperlipidemia Mother   . Diabetes Neg Hx   . Heart disease Neg Hx     Social History:  reports that she has never smoked. She has never used  smokeless tobacco. Her alcohol and drug histories are not on file.  REVIEW of systems:  She has had hypercholesterolemia and Had been on Zocor for a few years With Crestor her LDL is below 100 No side effects   Lab Results  Component Value Date   CHOL 166 02/11/2018   HDL 60.80 02/11/2018   LDLCALC 90 02/11/2018   LDLDIRECT 150.5 04/22/2013   TRIG 77.0 02/11/2018   CHOLHDL 3 02/11/2018    Eye exam in done in 2019   HYPERTENSION: Her blood pressure  had been consistently high in the office She has improved blood pressure with starting ramipril 2.5 mg daily No dizziness with this  BP Readings from Last 3 Encounters:  07/08/18 130/70  02/13/18 (!) 142/80  12/25/17 (!) 160/100     EXAM:  BP 130/70 (BP Location: Left Arm, Patient Position: Sitting, Cuff Size: Normal)   Pulse 100   Ht 5\' 3"  (1.6 m)   Wt 194 lb (88 kg)   SpO2 94%   BMI 34.37 kg/m     ASSESSMENT:  Diabetes 1 on insulin pump with inadequate control  See history of present illness for detailed discussion of  current management, blood sugar patterns and problems identified  The blood sugars are overall somewhat better with the 670 pump  A1c from [facility] pending  Although she is still using the Fiasp insulin she still has tendency to postprandial peaks at variable times especially breakfast Hypoglycemia has been minimized She is also managing entering carbohydrates more consistently also  Also has frequently high postprandial readings but may not be consistent She is still not entering carbohydrates or bolusing before eating consistently as directed Also discussed that she needs to do better with entering her blood sugar at the time of the pre-meal bolus which she is not doing  On waking up currently does tend to have low normal or low sugars fasting    RECOMMENDATIONS:  More consistent bolusing ahead of time before breakfast especially if the blood sugar is rising  Also she will bolused 1  unit for coffee in the morning  She will need to adjust her boluses at lunch and dinner based on what she is eating  Active insulin time 3-1/2 hours  More consistent routine for calibration and keep the sensor active more often  To call [employer] if she has any difficulty with excessive alarms on the pump and sensor  Regular exercise  Follow-up in April   A1c will be checked always from [facility] because of falsely low readings from the in-house lab.    HYPERCHOLESTEROLEMIA : To continue Crestor and follow-up lipids on the next visit   HYPERTENSION: This is mild and well controlled with ramipril  She has had her flu shot  Counseling time on subjects discussed in assessment and plan sections is over 50% of today's 25 minute visit   Reather Littler 07/08/2018, 8:32 AM

## 2018-07-09 LAB — HEMOGLOBIN A1C
Est. average glucose Bld gHb Est-mCnc: 169 mg/dL
HEMOGLOBIN A1C: 7.5 % — AB (ref 4.8–5.6)

## 2018-07-28 ENCOUNTER — Other Ambulatory Visit: Payer: Self-pay | Admitting: Endocrinology

## 2018-09-08 ENCOUNTER — Other Ambulatory Visit: Payer: Self-pay | Admitting: Endocrinology

## 2018-10-28 ENCOUNTER — Other Ambulatory Visit: Payer: BC Managed Care – PPO

## 2018-11-04 ENCOUNTER — Ambulatory Visit: Payer: BC Managed Care – PPO | Admitting: Endocrinology

## 2018-11-30 ENCOUNTER — Other Ambulatory Visit: Payer: Self-pay

## 2018-11-30 ENCOUNTER — Ambulatory Visit (HOSPITAL_COMMUNITY)
Admission: EM | Admit: 2018-11-30 | Discharge: 2018-11-30 | Disposition: A | Discharge status: Home / Self Care | Payer: BC Managed Care – PPO | Attending: Family Medicine | Admitting: Family Medicine

## 2018-11-30 ENCOUNTER — Encounter (HOSPITAL_COMMUNITY): Payer: Self-pay

## 2018-11-30 DIAGNOSIS — Z23 Encounter for immunization: Secondary | ICD-10-CM | POA: Diagnosis not present

## 2018-11-30 DIAGNOSIS — S81832A Puncture wound without foreign body, left lower leg, initial encounter: Secondary | ICD-10-CM

## 2018-11-30 DIAGNOSIS — S81802A Unspecified open wound, left lower leg, initial encounter: Secondary | ICD-10-CM

## 2018-11-30 MED ORDER — TETANUS-DIPHTH-ACELL PERTUSSIS 5-2.5-18.5 LF-MCG/0.5 IM SUSP
0.5000 mL | Freq: Once | INTRAMUSCULAR | Status: AC
  Administered 2018-11-30: 0.5 mL via INTRAMUSCULAR

## 2018-11-30 MED ORDER — TETANUS-DIPHTH-ACELL PERTUSSIS 5-2.5-18.5 LF-MCG/0.5 IM SUSP
INTRAMUSCULAR | Status: AC
  Filled 2018-11-30: qty 0.5

## 2018-11-30 NOTE — ED Triage Notes (Signed)
Pt cc she has a part thorn in her left lower leg.  Pt was removing a brush and thorn some how need up in her leg.

## 2018-11-30 NOTE — ED Provider Notes (Signed)
[facility]    CSN: [id number] Arrival date & time: 11/30/18  1116     History   Chief Complaint Chief Complaint  Patient presents with  . Appointment    1130  . thorn undernealth her skin    HPI Tammy Vega is a 48 y.o. female.   48 year old female with history of type 1 DM comes in for possible foreign body to the left anterior shin x 2 days. States was removing a bush when a thorn stuck on the shin. She irrigated with water and hydrogen peroxide. Has surrounding erythema without warmth or pain. Denies fever, chills, night sweats. Has had some drainage that is clear.      History reviewed. No pertinent past medical history.  Patient Active Problem List   Diagnosis Date Noted  . Type I (juvenile type) diabetes mellitus without mention of complication, uncontrolled 04/13/2013  . Other and unspecified hyperlipidemia 04/13/2013    History reviewed. No pertinent surgical history.  OB History   No obstetric history on file.      Home Medications    Prior to Admission medications   Medication Sig Start Date End Date Taking? Authorizing Provider  CONTOUR NEXT TEST test strip TEST BLOOD SUGAR 8 TIMES DAILY AS DIRECTED 02/09/18   Reather Littler, MD  FIASP 100 UNIT/ML SOLN INJECT A MAXIMUM OF 60 UNITS UNDER THE SKIN VIA INSULIN PUMP AS DIRECTED. 09/08/18   Reather Littler, MD  ibuprofen (ADVIL,MOTRIN) 200 MG tablet Take 200 mg by mouth every 6 (six) hours as needed for pain (PRN).    [provider]  levonorgestrel (MIRENA) 20 MCG/24HR IUD 1 each by Intrauterine route once.    [provider]  ramipril (ALTACE) 2.5 MG capsule TAKE 1 CAPSULE (2.5 MG TOTAL) BY MOUTH DAILY. 07/29/18   Reather Littler, MD  rosuvastatin (CRESTOR) 20 MG tablet TAKE 1 TABLET(20 MG) BY MOUTH DAILY 07/02/18   Reather Littler, MD    Family History Family History  Problem Relation Age of Onset  . Hyperlipidemia Mother   . Diabetes Neg Hx   . Heart disease Neg Hx     Social  History Social History   Tobacco Use  . Smoking status: Never Smoker  . Smokeless tobacco: Never Used  Substance Use Topics  . Alcohol use: Not on file  . Drug use: Not on file     Allergies   Patient has no known allergies.   Review of Systems Review of Systems  Reason unable to perform ROS: See HPI as above.     Physical Exam Triage Vital Signs ED Triage Vitals  Enc Vitals Group     BP 11/30/18 1154 (!) 146/76     Pulse Rate 11/30/18 1154 84     Resp 11/30/18 1154 18     Temp 11/30/18 1154 99 F (37.2 C)     Temp Source 11/30/18 1154 Oral     SpO2 11/30/18 1154 99 %     Weight 11/30/18 1150 198 lb (89.8 kg)     Height --      Head Circumference --      Peak Flow --      Pain Score 11/30/18 1150 2     Pain Loc --      Pain Edu? --      Excl. in GC? --    No data found.  Updated Vital Signs BP (!) 146/76 (BP Location: Right Arm)   Pulse 84   Temp  99 F (37.2 C) (Oral)   Resp 18   Wt 198 lb (89.8 kg)   SpO2 99%   BMI 35.07 kg/m   Physical Exam Constitutional:      General: She is not in acute distress.    Appearance: She is well-developed. She is not diaphoretic.  HENT:     Head: Normocephalic and atraumatic.  Eyes:     Conjunctiva/sclera: Conjunctivae normal.     Pupils: Pupils are equal, round, and reactive to light.  Skin:    Comments: Surrounding erythema. No warm. No induration. 0.103mm round puncture wound. No obvious foreign body seen. No tenderness to palpation. No obvious foreign body felt.   Neurological:     Mental Status: She is alert and oriented to person, place, and time.         UC Treatments / Results  Labs (all labs ordered are listed, but only abnormal results are displayed) Labs Reviewed - No data to display  EKG None  Radiology No results found.  Procedures Procedures (including critical care time)  Medications Ordered in UC Medications  Tdap (BOOSTRIX) injection 0.5 mL (0.5 mLs Intramuscular Given 11/30/18  1242)    Initial Impression / Assessment and Plan / UC Course  I have reviewed the triage vital signs and the nursing notes.  Pertinent labs & imaging results that were available during my care of the patient were reviewed by me and considered in my medical decision making (see chart for details).    Cleaned wound with ChloraPrep.  Used forceps to follow track of puncture wound, no obvious foreign body felt. Discussed with Dr Leonides GrillsLamptey, who also examined patient. Will dress wound with bactroban ointment. To monitor erythema, no obvious cellulitis at this time. Discussed return precautions and wound care. Patient expresses understanding and agrees to plan.  Final Clinical Impressions(s) / UC Diagnoses   Final diagnoses:  Open wound, lower leg, left, initial encounter   ED Prescriptions    None        Belinda FisherYu, Faydra Korman V, PA-C 11/30/18 1617

## 2018-11-30 NOTE — Discharge Instructions (Signed)
Tetanus updated today. No signs of foreign body today. No signs of infection. Redness around the wound most likely due to inflammation. Keep wound clean and dry. Monitor for spreading redness, increased warmth, purulent drainage, increased pain, please contact us to start antibiotic.

## 2019-01-08 ENCOUNTER — Other Ambulatory Visit: Payer: Self-pay | Admitting: Endocrinology

## 2019-01-09 ENCOUNTER — Other Ambulatory Visit: Payer: Self-pay | Admitting: Endocrinology

## 2019-02-13 ENCOUNTER — Other Ambulatory Visit: Payer: Self-pay | Admitting: Endocrinology

## 2019-02-15 ENCOUNTER — Telehealth: Payer: Self-pay | Admitting: Endocrinology

## 2019-02-15 ENCOUNTER — Other Ambulatory Visit: Payer: Self-pay

## 2019-02-15 MED ORDER — FIASP 100 UNIT/ML ~~LOC~~ SOLN: 60.0000 [IU] | Freq: Every day | SUBCUTANEOUS | 3 refills | Status: DC

## 2019-02-15 MED ORDER — RAMIPRIL 2.5 MG PO CAPS: 2.5000 mg | ORAL_CAPSULE | Freq: Every day | ORAL | 1 refills | Status: DC

## 2019-02-15 NOTE — Telephone Encounter (Signed)
MEDICATION: FIASP 100 UNIT/ML SOLN, AND ramipril (ALTACE) 2.5 MG capsule  PHARMACY:   [id number] - [place], [place] - [address] AT [place] [phone] (Phone) [phone] (Fax)    IS THIS A 90 DAY SUPPLY : Ramipril is  IS PATIENT OUT OF MEDICATION:  No  IF NOT; HOW MUCH IS LEFT: 1 week  LAST APPOINTMENT DATE: @7 /18/2020  NEXT APPOINTMENT DATE:@8 /09/2018  DO WE HAVE YOUR PERMISSION TO LEAVE A DETAILED MESSAGE: Yes  OTHER COMMENTS:    **Let patient know to contact pharmacy at the end of the day to make sure medication is ready. **  ** Please notify patient to allow 48-72 hours to process**  **Encourage patient to contact the pharmacy for refills or they can request refills through [facility]**

## 2019-02-15 NOTE — Telephone Encounter (Signed)
Rx has been sent  

## 2019-03-01 ENCOUNTER — Other Ambulatory Visit: Payer: Self-pay

## 2019-03-01 ENCOUNTER — Other Ambulatory Visit (INDEPENDENT_AMBULATORY_CARE_PROVIDER_SITE_OTHER): Payer: BC Managed Care – PPO

## 2019-03-01 DIAGNOSIS — E7800 Pure hypercholesterolemia, unspecified: Secondary | ICD-10-CM

## 2019-03-01 DIAGNOSIS — E1065 Type 1 diabetes mellitus with hyperglycemia: Secondary | ICD-10-CM

## 2019-03-01 LAB — URINALYSIS, ROUTINE W REFLEX MICROSCOPIC
Bilirubin Urine: NEGATIVE
Ketones, ur: NEGATIVE
Leukocytes,Ua: NEGATIVE
Nitrite: NEGATIVE
Specific Gravity, Urine: >=1.03 — AB (ref 1.000–1.030)
Total Protein, Urine: NEGATIVE
Urine Glucose: NEGATIVE
Urobilinogen, UA: 0.2 (ref 0.0–1.0)
pH: 5.5 (ref 5.0–8.0)

## 2019-03-01 LAB — COMPREHENSIVE METABOLIC PANEL
ALT: 14 U/L (ref 0–35)
AST: 13 U/L (ref 0–37)
Albumin: 3.8 g/dL (ref 3.5–5.2)
Alkaline Phosphatase: 46 U/L (ref 39–117)
BUN: 18 mg/dL (ref 6–23)
CO2: 23 mEq/L (ref 19–32)
Calcium: 8.8 mg/dL (ref 8.4–10.5)
Chloride: 106 mEq/L (ref 96–112)
Creatinine, Ser: 0.84 mg/dL (ref 0.40–1.20)
GFR: 72.37 mL/min (ref >60.00)
Glucose, Bld: 171 mg/dL — ABNORMAL HIGH (ref 70–99)
Potassium: 4.4 mEq/L (ref 3.5–5.1)
Sodium: 137 mEq/L (ref 135–145)
Total Bilirubin: 0.3 mg/dL (ref 0.2–1.2)
Total Protein: 6.1 g/dL (ref 6.0–8.3)

## 2019-03-01 LAB — MICROALBUMIN / CREATININE URINE RATIO
Creatinine,U: 125.9 mg/dL
Microalb Creat Ratio: 0.6 mg/g (ref 0.0–30.0)
Microalb, Ur: <0.7 mg/dL (ref 0.0–1.9)

## 2019-03-01 LAB — LIPID PANEL
Cholesterol: 176 mg/dL (ref 0–200)
HDL: 62.7 mg/dL (ref >39.00)
LDL Cholesterol: 103 mg/dL — ABNORMAL HIGH (ref 0–99)
NonHDL: 113.79
Total CHOL/HDL Ratio: 3
Triglycerides: 52 mg/dL (ref 0.0–149.0)
VLDL: 10.4 mg/dL (ref 0.0–40.0)

## 2019-03-02 ENCOUNTER — Ambulatory Visit: Payer: BC Managed Care – PPO | Admitting: Endocrinology

## 2019-03-02 LAB — HEMOGLOBIN A1C
Est. average glucose Bld gHb Est-mCnc: 163 mg/dL
Hgb A1c MFr Bld: 7.3 % — ABNORMAL HIGH (ref 4.8–5.6)

## 2019-03-03 ENCOUNTER — Ambulatory Visit: Payer: BC Managed Care – PPO | Admitting: Endocrinology

## 2019-03-03 ENCOUNTER — Other Ambulatory Visit: Payer: Self-pay

## 2019-03-03 ENCOUNTER — Encounter: Payer: Self-pay | Admitting: Endocrinology

## 2019-03-03 VITALS — BP 138/92 | HR 115 | Ht 63.0 in | Wt 196.2 lb

## 2019-03-03 DIAGNOSIS — E1065 Type 1 diabetes mellitus with hyperglycemia: Secondary | ICD-10-CM

## 2019-03-03 DIAGNOSIS — E7800 Pure hypercholesterolemia, unspecified: Secondary | ICD-10-CM | POA: Diagnosis not present

## 2019-03-03 NOTE — Progress Notes (Signed)
Patient ID: Tammy Vega, female   DOB: 1971-02-07, 48 y.o.   MRN: [id number]   Reason for Appointment: Insulin Pump followup:   History of Present Illness   Diagnosis: Type 1 DIABETES MELITUS, date of diagnosis:2003     DIABETES history: She was initially diagnosed with diabetes during pregnancy and required insulin. She went on insulin again in 2004 and was started on the pump in 2005.  Previously her control has been variable with needing periodic adjustments and A1c around 6-7 She also tried Symlin 45 g in 2008-9 with some benefit in her postprandial regimen but she stopped since she could not keep up with this  CURRENT insulin pump:  Medtronic 670   PUMP SETTINGS:  BASAL: Midnight = 0.  575, 3 AM = 0.7, 5 AM = 1.7, 7:30 AM = 1. 45, 9:30 AM = 1.55, 1 PM = 1.5, 4 PM = 0.7 and 7 PM = 1.8, 11 PM = 0.65  Carbohydrate ratio 1: 10 for lunch and supper and 1:15 for breakfast  Sensitivity 1:50, at 6 PM 1:40 with target 90-100  Her A1c is falsely low done in the local lab, more accurate done from [facility]   Current blood sugar patterns and problems identified are as follows:  A1c is 7.3 compared to 7.5 previously   Blood sugar patterns from CGM analysis and day-to-day management problems are discussed below  She says she was having much higher sugars about a week ago because of being on vacation and not eating healthy foods and snacks  Auto mode has been present 91%  Auto mode exit is frequently due to high blood sugars, 8 times in the last 2 weeks  Her time in target is similar to previous visit  She continues to use Fiasp, generally bolusing right before the meal   CONTINUOUS GLUCOSE MONITORING RECORD INTERPRETATION    Dates of Recording: 7/23 through 8/5  Sensor description: Guardian  Results statistics:   CGM use % of time  86  Average and SD  172+/-58  Time in range        63 %, unchanged  % Time Above 180  24  % Time above  250  13  % Time Below target  0    Glycemic patterns summary:   Hyperglycemic episodes    Hypoglycemic episodes have not occurred on the sensor However she thinks her blood sugars tend to get low normal and only transiently low if she is doing her walking exercise but currently not using a temporary target  Overnight periods: Blood sugars are improving through the night and very stable early morning between about 4 AM-7 AM.  No hypoglycemia  Preprandial periods: Blood sugars on waking up are excellent and very consistent  Glucose readings at lunchtime are very significantly high but better in the last week, likely higher previously been from poor diet and excessive snacks  Pre-supper blood sugars are also variable and better since last weekend, averages as below   Postprandial periods:   Her blood sugars are dependent on her diet especially with with inadequate coverage of higher fat meals or snacks   After breakfast:   Glucose is usually well controlled with some variability and a rise of only about 43 mg on an average.  She may sometimes just have a small snack and coffee but makes sure she is doing a bolus after waking up to prevent dawn phenomena  After lunch: Although  glucoses on average higher pre-meal it is until recently still persistently high over 200 postprandially  After dinner: Glucose readings on average are level up to 3 hours postprandially with moderate variability   PRE-MEAL Fasting Lunch Dinner Bedtime Overall  Glucose range:       Mean/median:  137  201  205   172   POST-MEAL PC Breakfast PC Lunch PC Dinner  Glucose range:     Mean/median:  160  232  211       Wt Readings from Last 3 Encounters:  03/03/19 196 lb 3.2 oz (89 kg)  11/30/18 198 lb (89.8 kg)  07/08/18 194 lb (88 kg)    Lab Results  Component Value Date   HGBA1C 7.3 (H) 03/01/2019   HGBA1C 7.5 (H) 07/08/2018   HGBA1C 5.0 07/03/2018   Lab Results  Component Value Date    MICROALBUR <0.7 03/01/2019   LDLCALC 103 (H) 03/01/2019   CREATININE 0.84 03/01/2019     .  Lab on 03/01/2019  Component Date Value Ref Range Status  . Color, Urine 03/01/2019 YELLOW  Yellow;Lt. Yellow;Straw;Dark Yellow;Amber;Green;Red;Brown Final  . APPearance 03/01/2019 CLEAR  Clear;Turbid;Slightly Cloudy;Cloudy Final  . Specific Gravity, Urine 03/01/2019 >=1.030* 1.000 - 1.030 Final  . pH 03/01/2019 5.5  5.0 - 8.0 Final  . Total Protein, Urine 03/01/2019 NEGATIVE  Negative Final  . Urine Glucose 03/01/2019 NEGATIVE  Negative Final  . Ketones, ur 03/01/2019 NEGATIVE  Negative Final  . Bilirubin Urine 03/01/2019 NEGATIVE  Negative Final  . Hgb urine dipstick 03/01/2019 SMALL* Negative Final  . Urobilinogen, UA 03/01/2019 0.2  0.0 - 1.0 Final  . Leukocytes,Ua 03/01/2019 NEGATIVE  Negative Final  . Nitrite 03/01/2019 NEGATIVE  Negative Final  . WBC, UA 03/01/2019 0-2/hpf  0-2/hpf Final  . RBC / HPF 03/01/2019 0-2/hpf  0-2/hpf Final  . Mucus, UA 03/01/2019 Presence of* None Final  . Squamous Epithelial / LPF 03/01/2019 Rare(0-4/hpf)  Rare(0-4/hpf) Final  . Hyaline Casts, UA 03/01/2019 Presence of* None Final  . Microalb, Ur 03/01/2019 <0.7  0.0 - 1.9 mg/dL Final  . Creatinine,U 16/10/960408/09/2018 125.9  mg/dL Final  . Microalb Creat Ratio 03/01/2019 0.6  0.0 - 30.0 mg/g Final  . Cholesterol 03/01/2019 176  0 - 200 mg/dL Final   ATP III Classification       Desirable:  < 200 mg/dL               Borderline High:  200 - 239 mg/dL          High:  > = 540240 mg/dL  . Triglycerides 03/01/2019 52.0  0.0 - 149.0 mg/dL Final   Normal:  <981<150 mg/dLBorderline High:  150 - 199 mg/dL  . HDL 03/01/2019 62.70  >39.00 mg/dL Final  . VLDL 19/14/782908/09/2018 10.4  0.0 - 40.0 mg/dL Final  . LDL Cholesterol 03/01/2019 103* 0 - 99 mg/dL Final  . Total CHOL/HDL Ratio 03/01/2019 3   Final                  Men          Women1/2 Average Risk     3.4          3.3Average Risk          5.0          4.42X Average Risk           9.6          7.13X Average Risk  15.0          11.0                      . NonHDL 03/01/2019 113.79   Final   NOTE:  Non-HDL goal should be 30 mg/dL higher than patient's LDL goal (i.e. LDL goal of < 70 mg/dL, would have non-HDL goal of < 100 mg/dL)  . Sodium 03/01/2019 137  135 - 145 mEq/L Final  . Potassium 03/01/2019 4.4  3.5 - 5.1 mEq/L Final  . Chloride 03/01/2019 106  96 - 112 mEq/L Final  . CO2 03/01/2019 23  19 - 32 mEq/L Final  . Glucose, Bld 03/01/2019 171* 70 - 99 mg/dL Final  . BUN 16/10/960408/09/2018 18  6 - 23 mg/dL Final  . Creatinine, Ser 03/01/2019 0.84  0.40 - 1.20 mg/dL Final  . Total Bilirubin 03/01/2019 0.3  0.2 - 1.2 mg/dL Final  . Alkaline Phosphatase 03/01/2019 46  39 - 117 U/L Final  . AST 03/01/2019 13  0 - 37 U/L Final  . ALT 03/01/2019 14  0 - 35 U/L Final  . Total Protein 03/01/2019 6.1  6.0 - 8.3 g/dL Final  . Albumin 54/09/811908/09/2018 3.8  3.5 - 5.2 g/dL Final  . Calcium 14/78/295608/09/2018 8.8  8.4 - 10.5 mg/dL Final  . GFR 21/30/865708/09/2018 72.37  >60.00 mL/min Final  . Hgb A1c MFr Bld 03/01/2019 7.3* 4.8 - 5.6 % Final   Comment:          Prediabetes: 5.7 - 6.4          Diabetes: >6.4          Glycemic control for adults with diabetes: <7.0   . Est. average glucose Bld gHb Est-m* 03/01/2019 163  mg/dL Final    Allergies as of 03/03/2019   No Known Allergies     Medication List       Accurate as of March 03, 2019  9:01 PM. If you have any questions, ask your nurse or doctor.        Contour Next Test test strip Generic drug: glucose blood TEST BLOOD SUGAR 8 TIMES DAILY AS DIRECTED   Fiasp 100 UNIT/ML Soln Generic drug: Insulin Aspart (w/Niacinamide) Inject 60 Units into the skin daily.   ibuprofen 200 MG tablet Commonly known as: ADVIL Take 200 mg by mouth every 6 (six) hours as needed for pain (PRN).   levonorgestrel 20 MCG/24HR IUD Commonly known as: MIRENA 1 each by Intrauterine route once.   ramipril 2.5 MG capsule Commonly known as: ALTACE Take 1  capsule (2.5 mg total) by mouth daily.   rosuvastatin 20 MG tablet Commonly known as: CRESTOR TAKE 1 TABLET BY MOUTH EVERY DAY       Allergies: No Known Allergies  No past medical history on file.  No past surgical history on file.  Family History  Problem Relation Age of Onset  . Hyperlipidemia Mother   . Diabetes Neg Hx   . Heart disease Neg Hx     Social History:  reports that she has never smoked. She has never used smokeless tobacco. No history on file for alcohol and drug.  REVIEW of systems:  She has had hypercholesterolemia and Had been on Zocor for a few years With Crestor her LDL previously was below 100 With recently not doing well on her diet her LDL is higher No side effects   Lab Results  Component Value Date   CHOL 176 03/01/2019  HDL 62.70 03/01/2019   LDLCALC 103 (H) 03/01/2019   LDLDIRECT 150.5 04/22/2013   TRIG 52.0 03/01/2019   CHOLHDL 3 03/01/2019    Eye exam in done in 2019   HYPERTENSION: Her blood pressure  had been consistently high in the office  She has improved blood pressure with starting ramipril 2.5 mg daily  Blood pressure is higher than usual, may be anxious However she has not checked her blood pressure at home even though she has a monitor  BP Readings from Last 3 Encounters:  03/03/19 (!) 138/92  11/30/18 (!) 146/76  07/08/18 130/70     EXAM:  BP (!) 138/92 (BP Location: Left Arm, Patient Position: Sitting, Cuff Size: Large)   Pulse (!) 115   Ht 5\' 3"  (1.6 m)   Wt 196 lb 3.2 oz (89 kg)   SpO2 97%   BMI 34.76 kg/m     ASSESSMENT:  Diabetes 1 on insulin pump with inadequate control  See history of present illness for detailed discussion of  current management, blood sugar patterns and problems identified  Now her A1c is 7.3, previously 7.5  Although she is doing better with the 670 pump her level of control is again not as good depending on her ability to cover her meals with adequate boluses especially if  she is having higher fat meals or snacks Overnight blood sugars are excellent as discussed above No hypoglycemia except with exercise She is not using temporary target with exercise   RECOMMENDATIONS:  She needs to bolus extra at the time of higher fat meals or snacks  She will also try to continue improving her diet overall and needs weight loss  Carbohydrate coverage at lunchtime which is at 1: 10 will be changed to 1: 9 to cover her lunch excursions better  For now will not change her correction factor  No need to change her active insulin time of 3-1/2 hours  Temporary target of 150 before she starts walking  Follow-up in December  Discussed potential for better control when she is able to get a 780 upgrade hopefully when available in the next few months  A1c will be checked always from [facility] because of falsely low readings from the in-house lab.    HYPERCHOLESTEROLEMIA : LDL is slightly higher than before This may be from not watching her diet recently with higher fat snacks  To continue Crestor 20 mg for now and follow-up lipids on the next visit   HYPERTENSION: This is variably controlled and blood pressure is higher today but not clear if this is from anxiety She does have a monitor at home and needs to start checking, she will call if blood pressure readings are consistently high  No microalbuminuria currently  She will have her eye exam report sent when this is done later this year  Follow-up in 4 months  Counseling time on subjects discussed in assessment and plan sections is over 50% of today's 25 minute visit   Elayne Snare 03/03/2019, 9:01 PM

## 2019-03-03 NOTE — Patient Instructions (Addendum)
Carb ratio 1: 9 at lunch  Temp target for walks  Omron BP meter, keep a check

## 2019-05-22 ENCOUNTER — Telehealth: Payer: Self-pay | Admitting: Family Medicine

## 2019-05-22 MED ORDER — FIASP 100 UNIT/ML ~~LOC~~ SOLN: 60.0000 [IU] | Freq: Every day | SUBCUTANEOUS | 0 refills | Status: DC

## 2019-05-22 MED ORDER — "INSULIN SYRINGE 27G X 1/2"" 0.5 ML MISC": 1.0000 | Freq: Every day | 0 refills | Status: DC

## 2019-05-22 MED ORDER — INSULIN GLARGINE 100 UNIT/ML ~~LOC~~ SOLN: 12.0000 [IU] | Freq: Every day | SUBCUTANEOUS | 0 refills | Status: DC

## 2019-05-22 MED ORDER — "INSULIN SYRINGE 31G X 5/16"" 0.5 ML MISC": 1.0000 | Freq: Every day | 0 refills | Status: AC

## 2019-05-22 MED ORDER — INSULIN DETEMIR 100 UNIT/ML ~~LOC~~ SOLN: SUBCUTANEOUS | 0 refills | Status: DC

## 2019-05-22 NOTE — Telephone Encounter (Signed)
Received a call from answering service about this patient- her insulin pump broke and she needs guidance in administering insulin without her pump.  I have called endocrinology for assistance- her endo Dr Dwyane Dee is on this week   I was not able to reach Dr Dwyane Dee and called Dr Cruzita Lederer who kindly offered me some advice.   12 units of long acting insulin should be a good place for her to start, she will continue to use her mealtime insulin. She should have her new pump within a couple of days   We had planned to use lantus but this is not covered by pt plan. Will sub insulin detemir which is covered

## 2019-05-22 NOTE — Telephone Encounter (Signed)
Per pt her basal pump rate is 0.5-1u/hour- she is not quite sure

## 2019-05-22 NOTE — Telephone Encounter (Signed)
Tammy Vega here is the patient you called me back about.  I think she is your primary endo patient.  Thank you JC

## 2019-05-24 ENCOUNTER — Telehealth: Payer: Self-pay

## 2019-05-24 NOTE — Telephone Encounter (Signed)
Pt returned phone call and she was given her pump settings. She wrote those down and read them back to me correctly.  Pt expressed that she would like help setting up her [employer] account for future issues like this. Pt was informed that the Diabetic educator could assist with this and I would inform her and she will call the pt when she becomes available.  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

## 2019-05-24 NOTE — Telephone Encounter (Signed)
Patient called in stating insulin pump died over the weekend is asking if she can get her insulin pump setting sent to her via e-mail:  [email or web address]   Please call and advise

## 2019-05-24 NOTE — Telephone Encounter (Signed)
Called pt and left voicemail requesting a call back. °

## 2019-05-24 NOTE — Telephone Encounter (Signed)
Carbohydrate coverage is 1: 15 breakfast, 1: 9 at lunch and 1: 10 at supper.  Otherwise settings are okay

## 2019-05-24 NOTE — Telephone Encounter (Signed)
According to last visit, pump settings should be as follows:  Please confirm, and pt will be notified.  PUMP SETTINGS:   BASAL: Midnight = 0.  575, 3 AM = 0.7, 5 AM = 1.7, 7:30 AM = 1. 45, 9:30 AM = 1.55, 1 PM = 1.5, 4 PM = 0.7 and 7 PM = 1.8, 11 PM = 0.65   Carbohydrate ratio 1: 9 for lunch and supper and 1:15 for breakfast  Sensitivity 1:50, at 6 PM 1:40 with target 90-100    Active insulin time of 3-1/2 hours

## 2019-05-25 NOTE — Telephone Encounter (Signed)
Message left on her machine to call me to discuss how to set up her care link account

## 2019-05-26 ENCOUNTER — Telehealth: Payer: Self-pay | Admitting: Nutrition

## 2019-05-26 NOTE — Telephone Encounter (Signed)
Opened in error

## 2019-05-26 NOTE — Telephone Encounter (Signed)
Another message left with log in information on how to set up a [employer] account.  Not able to send email, because has none.

## 2019-07-05 ENCOUNTER — Other Ambulatory Visit: Payer: Self-pay

## 2019-07-05 ENCOUNTER — Other Ambulatory Visit (INDEPENDENT_AMBULATORY_CARE_PROVIDER_SITE_OTHER): Payer: BC Managed Care – PPO

## 2019-07-05 DIAGNOSIS — E1065 Type 1 diabetes mellitus with hyperglycemia: Secondary | ICD-10-CM | POA: Diagnosis not present

## 2019-07-05 DIAGNOSIS — E7800 Pure hypercholesterolemia, unspecified: Secondary | ICD-10-CM

## 2019-07-05 LAB — COMPREHENSIVE METABOLIC PANEL
ALT: 13 U/L (ref 0–35)
AST: 12 U/L (ref 0–37)
Albumin: 4.1 g/dL (ref 3.5–5.2)
Alkaline Phosphatase: 50 U/L (ref 39–117)
BUN: 14 mg/dL (ref 6–23)
CO2: 26 mEq/L (ref 19–32)
Calcium: 8.8 mg/dL (ref 8.4–10.5)
Chloride: 103 mEq/L (ref 96–112)
Creatinine, Ser: 1 mg/dL (ref 0.40–1.20)
GFR: 59.1 mL/min — ABNORMAL LOW (ref >60.00)
Glucose, Bld: 224 mg/dL — ABNORMAL HIGH (ref 70–99)
Potassium: 4.7 mEq/L (ref 3.5–5.1)
Sodium: 136 mEq/L (ref 135–145)
Total Bilirubin: 0.5 mg/dL (ref 0.2–1.2)
Total Protein: 6.3 g/dL (ref 6.0–8.3)

## 2019-07-05 LAB — LDL CHOLESTEROL, DIRECT: Direct LDL: 104 mg/dL

## 2019-07-06 ENCOUNTER — Other Ambulatory Visit: Payer: Self-pay | Admitting: Endocrinology

## 2019-07-06 DIAGNOSIS — E1065 Type 1 diabetes mellitus with hyperglycemia: Secondary | ICD-10-CM

## 2019-07-06 NOTE — Progress Notes (Addendum)
Patient ID: Tammy Vega, female   DOB: [birth year], 48 y.o.   MRN: [id number]   Reason for Appointment: Insulin Pump followup:   History of Present Illness   Diagnosis: Type 1 DIABETES MELITUS, date of diagnosis:2003     DIABETES history: She was initially diagnosed with diabetes during pregnancy and required insulin. She went on insulin again in 2004 and was started on the pump in 2005.  Previously her control has been variable with needing periodic adjustments and A1c around 6-7 She also tried Symlin 45 g in 2008-9 with some benefit in her postprandial regimen but she stopped since she could not keep up with this  CURRENT insulin pump:  Medtronic 670   PUMP SETTINGS:  BASAL: Midnight = 0.  575, 3 AM = 0.7, 5 AM = 1.7, 7:30 AM = 1. 45, 9:30 AM = 1.55, 1 PM = 1.5, 4 PM = 0.7 and 7 PM = 1.8, 11 PM = 0.65  Carbohydrate ratio 1: 10 for lunch and supper and 1:15 for breakfast  Sensitivity 1:50, at 6 PM 1:40 with target 90-100 Active insulin time 3 hours  Her A1c is falsely low done in the [facility] lab, is accurate done from [facility]   Current blood sugar patterns and problems identified are as follows:  A1c is about the same at 7.4   Blood sugar patterns from CGM analysis and day-to-day management problems are discussed in the interpretation  As before she is having difficulty with postprandial hyperglycemia  Also appears to be sometimes having persistently high blood sugars even when bolusing for high readings  Carbohydrate coverage was changed on the last visit at lunchtime  Highest postprandial readings are after dinner on an average but also over 200 after lunch and at the time of reading bolus  She does a little walking after lunch but not doing much formal exercise  Auto mode has been present only 84 % of the time  Auto mode exit is either due to high blood sugars are maximum delivery, calibration usually 2 times  Hypoglycemia has  not been present  Her time in target is similar to previous visit  She continues to use Tammy Vega, she is usually bolusing right before the meal  Blood sugars are rising more quickly after lunch than any other meal   CONTINUOUS GLUCOSE MONITORING RECORD INTERPRETATION    Dates of Recording: 11/26 through 12/9  Sensor description: Guardian  Results statistics:   CGM use % of time  77  Average and SD  171+/-51  Time in range      61%  % Time Above 180  31  % Time above 250  8  % Time Below target  0    PRE-MEAL Fasting Lunch Dinner Bedtime Overall  Glucose range:  119-167      Mean/median:  151  166  196     POST-MEAL PC Breakfast PC Lunch PC Dinner  Glucose range:     Mean/median:  170  217  230    Glycemic patterns summary: Her sensor was only active 77 % of the time in the last 2 weeks Blood sugars are on an average persistently high between about 1 PM and 11 PM and averaging 180-200 However blood sugars are more stable and improved early morning  Hyperglycemic episodes are occurring primarily postprandially but no consistent pattern seen Also may have persistently high readings before dinnertime  Hypoglycemic episodes have not occurred  Overnight periods: Blood sugars are averaging about 160 at midnight and gradually decreasing until breakfast time  Preprandial periods:   Postprandial periods: Blood sugars are gradually going up after meals especially lunch and not showing any trend at about 3-hour postprandial reading   After breakfast:   Blood sugars are generally fairly well controlled with some variability and not rising more than 20 mg  After lunch:   Blood sugars are rising fairly frequently After dinner: Blood sugars are variably high although frequently are higher before eating also Last night blood sugar went up significantly after eating pizza Also blood sugar may be higher with higher fat meals    Wt Readings from Last 3 Encounters:  07/07/19 191 lb  9.6 oz (86.9 kg)  03/03/19 196 lb 3.2 oz (89 kg)  11/30/18 198 lb (89.8 kg)    Lab Results  Component Value Date   HGBA1C 7.4 (H) 07/06/2019   HGBA1C 5.0 07/05/2019   HGBA1C 7.3 (H) 03/01/2019   Lab Results  Component Value Date   MICROALBUR <0.7 03/01/2019   LDLCALC 103 (H) 03/01/2019   CREATININE 1.00 07/05/2019     .  Lab on 07/05/2019  Component Date Value Ref Range Status  . Direct LDL 07/05/2019 104.0  mg/dL Final   Optimal:  <100 mg/dLNear or Above Optimal:  100-129 mg/dLBorderline High:  130-159 mg/dLHigh:  160-189 mg/dLVery High:  >190 mg/dL  . Sodium 07/05/2019 136  135 - 145 mEq/L Final  . Potassium 07/05/2019 4.7  3.5 - 5.1 mEq/L Final  . Chloride 07/05/2019 103  96 - 112 mEq/L Final  . CO2 07/05/2019 26  19 - 32 mEq/L Final  . Glucose, Bld 07/05/2019 224* 70 - 99 mg/dL Final  . BUN 07/05/2019 14  6 - 23 mg/dL Final  . Creatinine, Ser 07/05/2019 1.00  0.40 - 1.20 mg/dL Final  . Total Bilirubin 07/05/2019 0.5  0.2 - 1.2 mg/dL Final  . Alkaline Phosphatase 07/05/2019 50  39 - 117 U/L Final  . AST 07/05/2019 12  0 - 37 U/L Final  . ALT 07/05/2019 13  0 - 35 U/L Final  . Total Protein 07/05/2019 6.3  6.0 - 8.3 g/dL Final  . Albumin 07/05/2019 4.1  3.5 - 5.2 g/dL Final  . GFR 07/05/2019 59.10* >60.00 mL/min Final  . Calcium 07/05/2019 8.8  8.4 - 10.5 mg/dL Final  . Hgb A1c MFr Bld 07/05/2019 5.0  4.6 - 6.5 % Final   Glycemic Control Guidelines for People with Diabetes:Non Diabetic:  <6%Goal of Therapy: <7%Additional Action Suggested:  >8%   . Hgb A1c MFr Bld 07/06/2019 7.4* 4.8 - 5.6 % Final   Comment:          Prediabetes: 5.7 - 6.4          Diabetes: >6.4          Glycemic control for adults with diabetes: <7.0   . Est. average glucose Bld gHb Est-m* 07/06/2019 166  mg/dL Final    Allergies as of 07/07/2019   No Known Allergies     Medication List       Accurate as of July 07, 2019  8:41 AM. If you have any questions, ask your nurse or doctor.         STOP taking these medications   insulin detemir 100 UNIT/ML injection Commonly known as: LEVEMIR Stopped by: Elayne Snare, MD     TAKE these medications   Contour Next Test test strip Generic drug: glucose blood  TEST BLOOD SUGAR 8 TIMES DAILY AS DIRECTED   Fiasp 100 UNIT/ML Soln Generic drug: Insulin Aspart (w/Niacinamide) Inject 60 Units into the skin daily.   ibuprofen 200 MG tablet Commonly known as: ADVIL Take 200 mg by mouth every 6 (six) hours as needed for pain (PRN).   insulin glargine 100 UNIT/ML injection Commonly known as: LANTUS Inject 0.12 mLs (12 Units total) into the skin daily. Take 12 units every morning until arrival of new insulin pump.   INSULIN SYRINGE .5CC/31GX5/16" 31G X 5/16" 0.5 ML Misc 1 each by Does not apply route daily.   levonorgestrel 20 MCG/24HR IUD Commonly known as: MIRENA 1 each by Intrauterine route once.   ramipril 2.5 MG capsule Commonly known as: ALTACE Take 1 capsule (2.5 mg total) by mouth daily.   rosuvastatin 20 MG tablet Commonly known as: CRESTOR TAKE 1 TABLET BY MOUTH EVERY DAY       Allergies: No Known Allergies  History reviewed. No pertinent past medical history.  History reviewed. No pertinent surgical history.  Family History  Problem Relation Age of Onset  . Hyperlipidemia Mother   . Diabetes Neg Hx   . Heart disease Neg Hx     Social History:  reports that she has never smoked. She has never used smokeless tobacco. No history on file for alcohol and drug.  REVIEW of systems:  She has had hypercholesterolemia and Had been on Zocor for a few years With Crestor her LDL previously was below 100 With recently not doing well on her diet her LDL is higher No side effects   Lab Results  Component Value Date   CHOL 176 03/01/2019   HDL 62.70 03/01/2019   LDLCALC 103 (H) 03/01/2019   LDLDIRECT 104.0 07/05/2019   TRIG 52.0 03/01/2019   CHOLHDL 3 03/01/2019    Eye exam in done in 2019    HYPERTENSION: Her blood pressure  had been consistently high in the office  She has improved blood pressure with starting ramipril 2.5 mg daily  Blood pressure is higher than usual, may be anxious  However she has not checked her blood pressure at home even though she has a monitor  BP Readings from Last 3 Encounters:  07/07/19 140/80  03/03/19 (!) 138/92  11/30/18 (!) 146/76     EXAM:  BP 140/80 (BP Location: Left Arm, Patient Position: Sitting, Cuff Size: Normal)   Pulse (!) 114   Ht  (1.6 m)   Wt 191 lb 9.6 oz (86.9 kg)   SpO2 98%   BMI 33.94 kg/m     ASSESSMENT:  Diabetes 1 on insulin pump with inadequate control  See history of present illness for detailed discussion of  current management, blood sugar patterns and problems identified  A1c is about the same at 7.4  She is still having postprandial hyperglycemia Does appear to be needing higher doses of insulin to cover her meals and likely also improve her diet with lower fat meals Also appears to be needing larger doses to correct her high readings which are occurring frequently especially in the evenings Recently has not used the sensor consistently all the time and also has had some auto mode exits  Does not appear to be benefiting from using Fiasp compared to NovoLog with relatively higher postprandial readings May also have some difficulty with remembering to bolus before eating consistently   RECOMMENDATIONS:  She needs to bolus additional 2 to 4 units for higher fat meals  Go back to  NovoLog  Her sensitivity will need to be changed to 1: 40 during the day  Carbohydrate coverage 1: 8 at lunchtime  We will also try active insulin time to be 2 hours 45 minutes instead of 3 hours  Encouraged her to be more active with exercise and longer walks  She may also have better control when she is able to get a 780 upgrade hopefully when available in the next few months  A1c will be checked always from  [facility] because of falsely low readings from the in-house lab.    HYPERCHOLESTEROLEMIA : LDL is slightly higher than before He likely can do better with improved diet and reducing high fat meals and snacks  To continue Crestor 20 mg for now and follow-up lipids on the next visit   HYPERTENSION: This is variably controlled and blood pressure is relatively better on this visit  No microalbuminuria   She will have her eye exam report sent when this is done later this year  Follow-up in 3-4 months  Counseling time on subjects discussed in assessment and plan sections is over 50% of today's 25 minute visit   Reather LittlerAjay Dionisio Aragones 07/07/2019, 8:41 AM

## 2019-07-07 ENCOUNTER — Other Ambulatory Visit: Payer: Self-pay

## 2019-07-07 ENCOUNTER — Ambulatory Visit: Payer: BC Managed Care – PPO | Admitting: Endocrinology

## 2019-07-07 ENCOUNTER — Encounter: Payer: Self-pay | Admitting: Endocrinology

## 2019-07-07 VITALS — BP 140/80 | HR 114 | Ht 63.0 in | Wt 191.6 lb

## 2019-07-07 DIAGNOSIS — E1065 Type 1 diabetes mellitus with hyperglycemia: Secondary | ICD-10-CM

## 2019-07-07 DIAGNOSIS — E7800 Pure hypercholesterolemia, unspecified: Secondary | ICD-10-CM

## 2019-07-07 LAB — HEMOGLOBIN A1C
Est. average glucose Bld gHb Est-mCnc: 166 mg/dL
Hgb A1c MFr Bld: 7.4 % — ABNORMAL HIGH (ref 4.8–5.6)

## 2019-07-07 MED ORDER — INSULIN ASPART 100 UNIT/ML ~~LOC~~ SOLN: SUBCUTANEOUS | 1 refills | Status: DC

## 2019-07-08 NOTE — Addendum Note (Signed)
Addended by: Elayne Snare on: 07/08/2019 11:43 AM   Modules accepted: Orders

## 2019-07-15 ENCOUNTER — Other Ambulatory Visit: Payer: Self-pay | Admitting: Endocrinology

## 2019-07-16 ENCOUNTER — Other Ambulatory Visit: Payer: Self-pay | Admitting: Endocrinology

## 2019-09-03 ENCOUNTER — Other Ambulatory Visit: Payer: Self-pay | Admitting: Endocrinology

## 2019-10-06 ENCOUNTER — Other Ambulatory Visit (INDEPENDENT_AMBULATORY_CARE_PROVIDER_SITE_OTHER): Payer: BC Managed Care – PPO

## 2019-10-06 ENCOUNTER — Other Ambulatory Visit: Payer: Self-pay

## 2019-10-06 DIAGNOSIS — E1065 Type 1 diabetes mellitus with hyperglycemia: Secondary | ICD-10-CM | POA: Diagnosis not present

## 2019-10-06 DIAGNOSIS — E7800 Pure hypercholesterolemia, unspecified: Secondary | ICD-10-CM | POA: Diagnosis not present

## 2019-10-06 LAB — LIPID PANEL
Cholesterol: 175 mg/dL (ref 0–200)
HDL: 60.1 mg/dL
LDL Cholesterol: 101 mg/dL — ABNORMAL HIGH (ref 0–99)
NonHDL: 115.16
Total CHOL/HDL Ratio: 3
Triglycerides: 69 mg/dL (ref 0.0–149.0)
VLDL: 13.8 mg/dL (ref 0.0–40.0)

## 2019-10-06 LAB — GLUCOSE, RANDOM: Glucose, Bld: 165 mg/dL — ABNORMAL HIGH (ref 70–99)

## 2019-10-07 LAB — HEMOGLOBIN A1C
Est. average glucose Bld gHb Est-mCnc: 166 mg/dL
Hgb A1c MFr Bld: 7.4 % — ABNORMAL HIGH (ref 4.8–5.6)

## 2019-10-11 ENCOUNTER — Ambulatory Visit: Payer: BC Managed Care – PPO | Attending: Internal Medicine

## 2019-10-11 DIAGNOSIS — Z23 Encounter for immunization: Secondary | ICD-10-CM

## 2019-10-11 NOTE — Progress Notes (Signed)
   Covid-19 Vaccination Clinic  Name:  Tammy Vega    MRN: [id number] DOB: 12/15/1970  10/11/2019  [name] was observed post Covid-19 immunization for 15 minutes without incident. She was provided with Vaccine Information Sheet and instruction to access the V-Safe system.   [name] was instructed to call 911 with any severe reactions post vaccine: Marland Kitchen Difficulty breathing  . Swelling of face and throat  . A fast heartbeat  . A bad rash all over body  . Dizziness and weakness   Immunizations Administered    Name Date Dose VIS Date Route   Pfizer COVID-19 Vaccine 10/11/2019  5:06 PM 0.3 mL 07/09/2019 Intramuscular   Manufacturer: [employer], [employer]   Lot: [id number]   NDC: [address]-3

## 2019-10-13 ENCOUNTER — Ambulatory Visit: Payer: BC Managed Care – PPO | Admitting: Endocrinology

## 2019-11-08 ENCOUNTER — Telehealth: Payer: BC Managed Care – PPO | Admitting: Endocrinology

## 2020-01-01 ENCOUNTER — Other Ambulatory Visit: Payer: Self-pay | Admitting: Endocrinology

## 2020-01-10 ENCOUNTER — Telehealth: Payer: Self-pay | Admitting: Endocrinology

## 2020-01-10 NOTE — Telephone Encounter (Signed)
-----   Message from Cherylann Parr sent at 01/04/2020 12:06 PM EDT ----- Regarding: RE: Needs appointment LMTCB to schedule - no mychart access ----- Message ----- From: Reather Littler, MD Sent: 01/02/2020   9:04 PM EDT To: Cherylann Parr Subject: Needs appointment                              Patient overdue for her 30-minute diabetes appointment.  Please call to schedule with labs before appointment

## 2020-01-10 NOTE — Telephone Encounter (Signed)
LMTCB to set up appt.

## 2020-01-12 NOTE — Telephone Encounter (Signed)
LMTCB to schedule 30 minute appt and prior labs

## 2020-01-13 ENCOUNTER — Other Ambulatory Visit: Payer: Self-pay | Admitting: Endocrinology

## 2020-02-01 ENCOUNTER — Other Ambulatory Visit: Payer: Self-pay | Admitting: Endocrinology

## 2020-02-28 ENCOUNTER — Other Ambulatory Visit (INDEPENDENT_AMBULATORY_CARE_PROVIDER_SITE_OTHER): Payer: BC Managed Care – PPO

## 2020-02-28 ENCOUNTER — Other Ambulatory Visit: Payer: Self-pay

## 2020-02-28 ENCOUNTER — Other Ambulatory Visit: Payer: Self-pay | Admitting: Endocrinology

## 2020-02-28 DIAGNOSIS — E7800 Pure hypercholesterolemia, unspecified: Secondary | ICD-10-CM

## 2020-02-28 DIAGNOSIS — E1065 Type 1 diabetes mellitus with hyperglycemia: Secondary | ICD-10-CM

## 2020-02-28 LAB — URINALYSIS, ROUTINE W REFLEX MICROSCOPIC
Bilirubin Urine: NEGATIVE
Ketones, ur: 15 — AB
Leukocytes,Ua: NEGATIVE
Nitrite: NEGATIVE
Specific Gravity, Urine: 1.02 (ref 1.000–1.030)
Total Protein, Urine: NEGATIVE
Urine Glucose: NEGATIVE
Urobilinogen, UA: 0.2 (ref 0.0–1.0)
pH: 6 (ref 5.0–8.0)

## 2020-02-28 LAB — COMPREHENSIVE METABOLIC PANEL
ALT: 16 U/L (ref 0–35)
AST: 15 U/L (ref 0–37)
Albumin: 3.8 g/dL (ref 3.5–5.2)
Alkaline Phosphatase: 47 U/L (ref 39–117)
BUN: 11 mg/dL (ref 6–23)
CO2: 26 mEq/L (ref 19–32)
Calcium: 8.5 mg/dL (ref 8.4–10.5)
Chloride: 103 mEq/L (ref 96–112)
Creatinine, Ser: 0.86 mg/dL (ref 0.40–1.20)
GFR: 70.14 mL/min (ref >60.00)
Glucose, Bld: 185 mg/dL — ABNORMAL HIGH (ref 70–99)
Potassium: 4.3 mEq/L (ref 3.5–5.1)
Sodium: 135 mEq/L (ref 135–145)
Total Bilirubin: 0.5 mg/dL (ref 0.2–1.2)
Total Protein: 6 g/dL (ref 6.0–8.3)

## 2020-02-28 LAB — LIPID PANEL
Cholesterol: 173 mg/dL (ref 0–200)
HDL: 65.6 mg/dL (ref >39.00)
LDL Cholesterol: 92 mg/dL (ref 0–99)
NonHDL: 106.92
Total CHOL/HDL Ratio: 3
Triglycerides: 77 mg/dL (ref 0.0–149.0)
VLDL: 15.4 mg/dL (ref 0.0–40.0)

## 2020-02-28 LAB — MICROALBUMIN / CREATININE URINE RATIO
Creatinine,U: 116.4 mg/dL
Microalb Creat Ratio: 0.6 mg/g (ref 0.0–30.0)
Microalb, Ur: <0.7 mg/dL (ref 0.0–1.9)

## 2020-02-29 LAB — HEMOGLOBIN A1C
Est. average glucose Bld gHb Est-mCnc: 166 mg/dL
Hgb A1c MFr Bld: 7.4 % — ABNORMAL HIGH (ref 4.8–5.6)

## 2020-03-01 ENCOUNTER — Telehealth: Payer: Self-pay | Admitting: *Deleted

## 2020-03-01 ENCOUNTER — Other Ambulatory Visit: Payer: Self-pay

## 2020-03-01 ENCOUNTER — Ambulatory Visit (INDEPENDENT_AMBULATORY_CARE_PROVIDER_SITE_OTHER): Payer: BC Managed Care – PPO | Admitting: Endocrinology

## 2020-03-01 ENCOUNTER — Encounter: Payer: Self-pay | Admitting: Endocrinology

## 2020-03-01 VITALS — BP 138/78 | HR 103 | Ht 63.0 in | Wt 204.8 lb

## 2020-03-01 DIAGNOSIS — E7800 Pure hypercholesterolemia, unspecified: Secondary | ICD-10-CM

## 2020-03-01 DIAGNOSIS — E1065 Type 1 diabetes mellitus with hyperglycemia: Secondary | ICD-10-CM

## 2020-03-01 NOTE — Progress Notes (Signed)
Patient ID: Tammy Vega, female   DOB: 07/21/1971, 49 y.o.   MRN: [id number]   Reason for Appointment: Insulin Pump followup:   History of Present Illness   Diagnosis: Type 1 DIABETES MELITUS, date of diagnosis:2003     DIABETES history: She was initially diagnosed with diabetes during pregnancy and required insulin. She went on insulin again in 2004 and was started on the pump in 2005.  Previously her control has been variable with needing periodic adjustments and A1c around 6-7 She also tried Symlin 45 g in 2008-9 with some benefit in her postprandial regimen but she stopped since she could not keep up with this  CURRENT insulin pump:  Medtronic 670   PUMP SETTINGS:  BASAL: Midnight = 0.  575, 3 AM = 0.7, 5 AM = 1.7, 7:30 AM = 1. 45, 9:30 AM = 1.55, 1 PM = 1.5, 4 PM = 0.7 and 7 PM = 1.8, 11 PM = 0.65  Carbohydrate ratio 1: 10 for lunch and supper and 1:15 for breakfast  Sensitivity 1:50, at 6 PM 1:40 with target 90-100 Active insulin time 3 hours  Her A1c is falsely low done in the [facility] lab, is accurate done from [facility]  Current blood sugar patterns and problems identified are as follows:  A1c is the same at 7.4   Blood sugar patterns from CGM analysis and day-to-day management and difficulties with control are discussed in the interpretation  Continues to have postprandial hyperglycemia especially after lunch when blood sugars are the highest  This is likely to be from either inadequate bolusing for the type of meal, late bolusing or relatively higher fat meals especially when eating out on her vacation  No hypoglycemia at all  Overall time in target is about the same as previously  She is back on NovoLog   CONTINUOUS GLUCOSE MONITORING RECORD INTERPRETATION    Dates of Recording: Last 2 weeks  Sensor description: Guardian  Results statistics:   CGM use % of time  84  Average and SD  175+/-64  Time in range  60     %   % Time Above 180  25  % Time above 250  15  % Time Below target  0    PRE-MEAL Fasting Lunch Dinner Bedtime Overall  Glucose range:       Mean/median:  155  195  225     POST-MEAL PC Breakfast PC Lunch PC Dinner  Glucose range:     Mean/median:  191  237  242    Glycemic patterns summary: Blood sugars are rising progressively between waking up time and about 3 PM overall with postprandial spikes at lunchtime.  Also has mostly high readings in the evenings with some postprandial spikes also Overnight blood sugars are excellent with minimal variability  Hyperglycemic episodes are occurring primarily postprandially at all meals although more consistently after lunch  Hypoglycemic episodes did not occur  Overnight periods: Blood sugars averaging about 160 at midnight and then gradually declining and stay very consistently at target until about 7 AM.  No variability or hypoglycemia  Preprandial periods: Blood sugars are fairly good at breakfast time with mild dawn phenomena Blood sugars are mostly averaging higher at lunchtime At dinnertime blood sugars are more variable with average is above  Postprandial periods:   After breakfast: Has only occasional significant hyperglycemia After lunch: On an average blood sugars are rising significantly but not consistently After  dinner: Blood sugar is are not overall higher compared to premeal reading but appears to have frequently boluses Blood sugars at 3 hours are usually flat compared to 2 hours on average  Prior readings:    CGM use % of time  77  Average and SD  171+/-51  Time in range      61%  % Time Above 180  31  % Time above 250  8  % Time Below target  0    PRE-MEAL Fasting Lunch Dinner Bedtime Overall  Glucose range:  119-167      Mean/median:  151  166  196     POST-MEAL PC Breakfast PC Lunch PC Dinner  Glucose range:     Mean/median:  170  217  230    Glycemic patterns summary: Her sensor was only active 77 %  of the time in the last 2 weeks Blood sugars are on an average persistently high between about 1 PM and 11 PM and averaging 180-200 However blood sugars are more stable and improved early morning   Wt Readings from Last 3 Encounters:  03/01/20 204 lb 12.8 oz (92.9 kg)  07/07/19 191 lb 9.6 oz (86.9 kg)  03/03/19 196 lb 3.2 oz (89 kg)    Lab Results  Component Value Date   HGBA1C 7.4 (H) 02/28/2020   HGBA1C 7.4 (H) 10/06/2019   HGBA1C 7.4 (H) 07/06/2019   Lab Results  Component Value Date   MICROALBUR <0.7 02/28/2020   LDLCALC 92 02/28/2020   CREATININE 0.86 02/28/2020      Lab on 02/28/2020  Component Date Value Ref Range Status  . Cholesterol 02/28/2020 173  0 - 200 mg/dL Final   ATP III Classification       Desirable:  < 200 mg/dL               Borderline High:  200 - 239 mg/dL          High:  > = 960240 mg/dL  . Triglycerides 02/28/2020 77.0  0 - 149 mg/dL Final   Normal:  <454<150 mg/dLBorderline High:  150 - 199 mg/dL  . HDL 02/28/2020 65.60  >39.00 mg/dL Final  . VLDL 09/81/191408/08/2019 15.4  0.0 - 40.0 mg/dL Final  . LDL Cholesterol 02/28/2020 92  0 - 99 mg/dL Final  . Total CHOL/HDL Ratio 02/28/2020 3   Final                  Men          Women1/2 Average Risk     3.4          3.3Average Risk          5.0          4.42X Average Risk          9.6          7.13X Average Risk          15.0          11.0                      . NonHDL 02/28/2020 106.92   Final   NOTE:  Non-HDL goal should be 30 mg/dL higher than patient's LDL goal (i.e. LDL goal of < 70 mg/dL, would have non-HDL goal of < 100 mg/dL)  . Color, Urine 02/28/2020 YELLOW  Yellow;Lt. Yellow;Straw;Dark Yellow;Amber;Green;Red;Brown Final  . APPearance 02/28/2020 CLEAR  Clear;Turbid;Slightly Cloudy;Cloudy Final  . Specific Gravity,  Urine 02/28/2020 1.020  1.000 - 1.030 Final  . pH 02/28/2020 6.0  5.0 - 8.0 Final  . Total Protein, Urine 02/28/2020 NEGATIVE  Negative Final  . Urine Glucose 02/28/2020 NEGATIVE  Negative Final  .  Ketones, ur 02/28/2020 15* Negative Final  . Bilirubin Urine 02/28/2020 NEGATIVE  Negative Final  . Hgb urine dipstick 02/28/2020 TRACE-LYSED* Negative Final  . Urobilinogen, UA 02/28/2020 0.2  0.0 - 1.0 Final  . Leukocytes,Ua 02/28/2020 NEGATIVE  Negative Final  . Nitrite 02/28/2020 NEGATIVE  Negative Final  . WBC, UA 02/28/2020 3-6/hpf* 0-2/hpf Final  . RBC / HPF 02/28/2020 0-2/hpf  0-2/hpf Final  . Squamous Epithelial / LPF 02/28/2020 Rare(0-4/hpf)  Rare(0-4/hpf) Final  . Bacteria, UA 02/28/2020 Few(10-50/hpf)* None Final  . Microalb, Ur 02/28/2020 <0.7  0.0 - 1.9 mg/dL Final  . Creatinine,U 85/46/2703 116.4  mg/dL Final  . Microalb Creat Ratio 02/28/2020 0.6  0.0 - 30.0 mg/g Final  . Sodium 02/28/2020 135  135 - 145 mEq/L Final  . Potassium 02/28/2020 4.3  3.5 - 5.1 mEq/L Final  . Chloride 02/28/2020 103  96 - 112 mEq/L Final  . CO2 02/28/2020 26  19 - 32 mEq/L Final  . Glucose, Bld 02/28/2020 185* 70 - 99 mg/dL Final  . BUN 50/03/3817 11  6 - 23 mg/dL Final  . Creatinine, Ser 02/28/2020 0.86  0.40 - 1.20 mg/dL Final  . Total Bilirubin 02/28/2020 0.5  0.2 - 1.2 mg/dL Final  . Alkaline Phosphatase 02/28/2020 47  39 - 117 U/L Final  . AST 02/28/2020 15  0 - 37 U/L Final  . ALT 02/28/2020 16  0 - 35 U/L Final  . Total Protein 02/28/2020 6.0  6.0 - 8.3 g/dL Final  . Albumin 29/93/7169 3.8  3.5 - 5.2 g/dL Final  . GFR 67/89/3810 70.14  >60.00 mL/min Final  . Calcium 02/28/2020 8.5  8.4 - 10.5 mg/dL Final  . Hgb F7P MFr Bld 02/28/2020 7.4* 4.8 - 5.6 % Final   Comment:          Prediabetes: 5.7 - 6.4          Diabetes: >6.4          Glycemic control for adults with diabetes: <7.0   . Est. average glucose Bld gHb Est-m* 02/28/2020 166  mg/dL Final    Allergies as of 03/01/2020   No Known Allergies     Medication List       Accurate as of March 01, 2020 10:21 AM. If you have any questions, ask your nurse or doctor.        Contour Next Test test strip Generic drug: glucose  blood TEST BLOOD SUGAR 8 TIMES DAILY AS DIRECTED   ibuprofen 200 MG tablet Commonly known as: ADVIL Take 200 mg by mouth every 6 (six) hours as needed for pain (PRN).   insulin aspart 100 UNIT/ML injection Commonly known as: NovoLOG USE MAX OF 70 UNITS DAILY VIA INSULIN PUMP.   INSULIN SYRINGE .5CC/31GX5/16" 31G X 5/16" 0.5 ML Misc 1 each by Does not apply route daily.   levonorgestrel 20 MCG/24HR IUD Commonly known as: MIRENA 1 each by Intrauterine route once.   ramipril 2.5 MG capsule Commonly known as: ALTACE TAKE 1 CAPSULE BY MOUTH EVERY DAY   rosuvastatin 20 MG tablet Commonly known as: CRESTOR TAKE 1 TABLET BY MOUTH EVERY DAY.  Please schedule follow-up appointment for further refills       Allergies: No Known Allergies  No past medical  history on file.  No past surgical history on file.  Family History  Problem Relation Age of Onset  . Hyperlipidemia Mother   . Diabetes Neg Hx   . Heart disease Neg Hx     Social History:  reports that she has never smoked. She has never used smokeless tobacco. No history on file for alcohol use and drug use.  REVIEW of systems:  She has had hypercholesterolemia and Had been on Zocor for a few years With Crestor her LDL  was below 100  No side effects   Lab Results  Component Value Date   CHOL 173 02/28/2020   HDL 65.60 02/28/2020   LDLCALC 92 02/28/2020   LDLDIRECT 104.0 07/05/2019   TRIG 77.0 02/28/2020   CHOLHDL 3 02/28/2020    Eye exam in done in 2019   HYPERTENSION: Her blood pressure  had been consistently high in the office  She has improved blood pressure with starting ramipril 2.5 mg daily  Blood pressure is higher than usual, may be anxious  However she has not checked her blood pressure at home even though she has a monitor  BP Readings from Last 3 Encounters:  03/01/20 138/78  07/07/19 140/80  03/03/19 (!) 138/92     EXAM:  BP 138/78 (BP Location: Left Arm, Patient Position: Sitting,  Cuff Size: Large)   Pulse (!) 103   Ht 5\' 3"  (1.6 m)   Wt 204 lb 12.8 oz (92.9 kg)   SpO2 98%   BMI 36.28 kg/m   Diabetic Foot Exam - Simple   Simple Foot Form Diabetic Foot exam was performed with the following findings: Yes   Visual Inspection No deformities, no ulcerations, no other skin breakdown bilaterally: Yes Sensation Testing Intact to touch and monofilament testing bilaterally: Yes Pulse Check Posterior Tibialis and Dorsalis pulse intact bilaterally: Yes Comments      ASSESSMENT:  Diabetes 1 on insulin pump with inadequate control  See history of present illness for detailed discussion of  current management, blood sugar patterns and problems identified  A1c is the same at 7.4  She has 40% of readings above 180 and most of the hyperglycemia is postprandial This is despite being in the auto mode about 90% of the time Some of the high sugars are related to late boluses, eating out or possibly need for better carbohydrate coverage ratio at lunchtime She is not able to get coverage for Lyumjev at present and is on NovoLog   RECOMMENDATIONS:  She needs to bolus before starting to eat consistently especially at suppertime  Additional 2 to 4 units for higher fat meals  Trial of OZEMPIC Explained to the patient what GLP-1 drugs are, the sites of actions and the body, reduction of hunger sensation and improved insulin secretion.  Discussed the benefit of weight loss with these medications. Explained possible side effects of OZEMPIC, most commonly nausea that usually improves over time.  Also explained safety information associated with the medication Demonstrated the medication injection device and injection technique to the patient.  To start the injections with the 0.25 mg dosage weekly for the first 4 injections and then go up to 0.5 mg weekly if no continued nausea She will call if this is working and will send her prescription when needed  Patient brochure on  Ozempic with enclosed co-pay card given  No change in settings unless she is not able to take Ozempic and may consider 1: 6 coverage for lunch  Encouraged her to start  exercise and regular walks   A1c will be checked always from [facility] because of falsely low readings from the in-house lab.    HYPERCHOLESTEROLEMIA : LDL is improving without any change in medication and probably is doing a little better with diet this summer She will continue to watch her diet  To continue Crestor 20 mg for now and follow-up lipids twice a year   HYPERTENSION: This is variably controlled and blood pressure is relatively better on this visit, however likely has some whitecoat syndrome also  No microalbuminuria Foot exam normal today  She will have her eye exam report sent when this is done later this year  Follow-up in 4 months   Elina Streng 03/01/2020, 10:21 AM

## 2020-03-01 NOTE — Telephone Encounter (Signed)
Medication Sample . Pt came into office for visit and picked up sample of Ozempic 2mg/1.5ml  per Dr. Kumar's instructions. . Medication was signed out according to sample medication policy.  . Medication was labeled using approved label. . Medication sample was added to patient's medication list as a sample medication that had been given. . Medication was explained to patient and patient verbalized understanding of how to take properly.  . Pt was also instructed to call this office if any questions or concerns arise. Pt did verbalize understanding of this.  Lot number for medication is: [id number]  Expiration for medication is:09/2021 

## 2020-03-01 NOTE — Patient Instructions (Signed)
Start OZEMPIC injections by dialing 0.25 mg on the pen as shown once weekly on the same day of the week.   You may inject in the sides of the stomach, outer thigh or arm as indicated in the brochure given. If you have any difficulties using the pen see the video at HowtoUseOzempic.[email or web address]  You will feel fullness of the stomach with starting the medication and should try to keep the portions at meals small.  You may experience nausea in the first few days which usually gets better over time    After 4 weeks increase the dose to 0.5 mg weekly  If you have any questions or persistent side effects please call the office   You may also talk to a nurse educator with [employer] at [phone] Useful website: [email or web address]     

## 2020-03-22 ENCOUNTER — Other Ambulatory Visit: Payer: Self-pay | Admitting: Endocrinology

## 2020-04-29 ENCOUNTER — Other Ambulatory Visit: Payer: Self-pay | Admitting: Endocrinology

## 2020-06-02 ENCOUNTER — Other Ambulatory Visit (INDEPENDENT_AMBULATORY_CARE_PROVIDER_SITE_OTHER): Payer: BC Managed Care – PPO

## 2020-06-02 ENCOUNTER — Other Ambulatory Visit: Payer: Self-pay

## 2020-06-02 DIAGNOSIS — E1065 Type 1 diabetes mellitus with hyperglycemia: Secondary | ICD-10-CM | POA: Diagnosis not present

## 2020-06-02 LAB — GLUCOSE, RANDOM: Glucose, Bld: 127 mg/dL — ABNORMAL HIGH (ref 70–99)

## 2020-06-03 LAB — HEMOGLOBIN A1C
Est. average glucose Bld gHb Est-mCnc: 171 mg/dL
Hgb A1c MFr Bld: 7.6 % — ABNORMAL HIGH (ref 4.8–5.6)

## 2020-06-08 ENCOUNTER — Other Ambulatory Visit: Payer: Self-pay

## 2020-06-08 ENCOUNTER — Ambulatory Visit: Payer: BC Managed Care – PPO | Admitting: Endocrinology

## 2020-06-08 ENCOUNTER — Encounter: Payer: Self-pay | Admitting: Endocrinology

## 2020-06-08 VITALS — BP 118/82 | HR 103 | Ht 63.0 in | Wt 191.8 lb

## 2020-06-08 DIAGNOSIS — Z23 Encounter for immunization: Secondary | ICD-10-CM

## 2020-06-08 DIAGNOSIS — E7800 Pure hypercholesterolemia, unspecified: Secondary | ICD-10-CM

## 2020-06-08 DIAGNOSIS — E1065 Type 1 diabetes mellitus with hyperglycemia: Secondary | ICD-10-CM | POA: Diagnosis not present

## 2020-06-08 LAB — HM DIABETES EYE EXAM

## 2020-06-08 MED ORDER — OZEMPIC (0.25 OR 0.5 MG/DOSE) 2 MG/1.5ML ~~LOC~~ SOPN: 0.5000 mg | PEN_INJECTOR | SUBCUTANEOUS | 2 refills | Status: DC

## 2020-06-08 NOTE — Patient Instructions (Signed)
Take Bolus 15 min before meals  Carb ratio 1;7 AT LUNCH

## 2020-06-08 NOTE — Progress Notes (Signed)
Patient ID: Tammy Vega, female   DOB: 11-29-1970, 49 y.o.   MRN: [id number]   Reason for Appointment: Insulin Pump followup:   History of Present Illness   Diagnosis: Type 1 DIABETES MELITUS, date of diagnosis:2003     DIABETES history: She was initially diagnosed with diabetes during pregnancy and required insulin. She went on insulin again in 2004 and was started on the pump in 2005.  Previously her control has been variable with needing periodic adjustments and A1c around 6-7 She also tried Symlin 45 g in 2008-9 with some benefit in her postprandial regimen but she stopped since she could not keep up with this  CURRENT insulin pump:  Medtronic 670   PUMP SETTINGS:  BASAL: Midnight = 0.  575, 3 AM = 0.7, 5 AM = 1.7, 7:30 AM = 1. 45, 9:30 AM = 1.55, 1 PM = 1.5, 4 PM = 0.7 and 7 PM = 1.8, 11 PM = 0.65  Carbohydrate ratio 1: 10 for lunch and supper and 1:15 for breakfast  Sensitivity 1:50, at 6 PM 1:40 with target 90-100 Active insulin time 3 hours  Her A1c is falsely low done in the [facility] lab, is accurate done from [facility]  Current blood sugar patterns and problems identified are as follows:  A1c is the 7.6, previously 7.4   Blood sugar patterns from CGM analysis and day-to-day management of her boluses, diet and reasons for hyperglycemia were reviewed in detail   Overall time in target is improved, previously was only 60% and now 68%  She is on NovoLog since she did not think she was benefiting from [place]  Also is on a trial of Ozempic although she only started about 5 weeks ago with a sample that was previously given  With this she is having nausea although it is getting better and she has taken the 0.5 mg dose without any increase in nausea  She appears to have lost about 13 pounds with a relatively decreased appetite  Again HYPERGLYCEMIA is mostly related to late or missed boluses especially at lunch  Auto mode exits have been  because of low calibration, high blood sugar and alarms, auto mode present 80% of the time  Her CGM is interpreted as follows:   Overnight blood sugars are very stable and at target at least after about 2 AM through 7 AM  Blood sugars tend to rise progressively after 7 AM overall and also further increase around 2-3 PM.  Advised hypoglycemia only occasional only late evening after 10 PM  Rarely may have low normal sugars, generally more in the manual mode possibly from overcorrection  Premeal blood sugars are higher at breakfast and lunch but at lunch mostly because of delayed boluses  Overall rise in blood sugar after meals is not excessive  Dawn phenomenon is variable from day-to-day  CGM use % of time  85  2-week average/SD  160+/-51  Time in range      68%  % Time Above 180  24  % Time above 250 7  % Time Below 70 1     PRE-MEAL  Premeal a.m. Lunch Dinner Bedtime Overall  Glucose range:       Averages:  171  192  155     POST-MEAL PC Breakfast PC Lunch PC Dinner  Glucose range:      Averages: 231  213  180    Previous data:   CGM use %  of time  84  Average and SD  175+/-64  Time in range  60     %  % Time Above 180  25  % Time above 250  15  % Time Below target  0    PRE-MEAL Fasting Lunch Dinner Bedtime Overall  Glucose range:       Mean/median:  155  195  225     POST-MEAL PC Breakfast PC Lunch PC Dinner  Glucose range:     Mean/median:  191  237  242    Glycemic patterns summary: Blood sugars are rising progressively between waking up time and about 3 PM overall with postprandial spikes at lunchtime.  Also has mostly high readings in the evenings with some postprandial spikes also Overnight blood sugars are excellent with minimal variability    Wt Readings from Last 3 Encounters:  06/08/20 191 lb 12.8 oz (87 kg)  03/01/20 204 lb 12.8 oz (92.9 kg)  07/07/19 191 lb 9.6 oz (86.9 kg)    Lab Results  Component Value Date   HGBA1C 7.6 (H) 06/02/2020    HGBA1C 7.4 (H) 02/28/2020   HGBA1C 7.4 (H) 10/06/2019   Lab Results  Component Value Date   MICROALBUR <0.7 02/28/2020   LDLCALC 92 02/28/2020   CREATININE 0.86 02/28/2020      Lab on 06/02/2020  Component Date Value Ref Range Status  . Glucose, Bld 06/02/2020 127* 70 - 99 mg/dL Final  . Hgb Z3Y MFr Bld 06/02/2020 7.6* 4.8 - 5.6 % Final   Comment:          Prediabetes: 5.7 - 6.4          Diabetes: >6.4          Glycemic control for adults with diabetes: <7.0   . Est. average glucose Bld gHb Est-m* 06/02/2020 171  mg/dL Final    Allergies as of 06/08/2020   No Known Allergies     Medication List       Accurate as of June 08, 2020  9:33 AM. If you have any questions, ask your nurse or doctor.        Contour Next Test test strip Generic drug: glucose blood TEST BLOOD SUGAR 8 TIMES DAILY AS DIRECTED   ibuprofen 200 MG tablet Commonly known as: ADVIL Take 200 mg by mouth every 6 (six) hours as needed for pain (PRN).   insulin aspart 100 UNIT/ML injection Commonly known as: NovoLOG USE MAX OF 70 UNITS DAILY VIA INSULIN PUMP.   INSULIN SYRINGE .5CC/31GX5/16" 31G X 5/16" 0.5 ML Misc 1 each by Does not apply route daily.   levonorgestrel 20 MCG/24HR IUD Commonly known as: MIRENA 1 each by Intrauterine route once.   Ozempic (1 MG/DOSE) 2 MG/1.5ML Sopn Generic drug: Semaglutide (1 MG/DOSE) Inject 2 mg into the skin.   ramipril 2.5 MG capsule Commonly known as: ALTACE TAKE 1 CAPSULE BY MOUTH EVERY DAY   rosuvastatin 20 MG tablet Commonly known as: CRESTOR TAKE 1 TABLET BY MOUTH EVERY DAY.       Allergies: No Known Allergies  No past medical history on file.  No past surgical history on file.  Family History  Problem Relation Age of Onset  . Hyperlipidemia Mother   . Diabetes Neg Hx   . Heart disease Neg Hx     Social History:  reports that she has never smoked. She has never used smokeless tobacco. No history on file for alcohol use and drug  use.  REVIEW  of systems:  She has had hypercholesterolemia and Had been on Zocor for a few years With Crestor her LDL  was below 100  No side effects   Lab Results  Component Value Date   CHOL 173 02/28/2020   HDL 65.60 02/28/2020   LDLCALC 92 02/28/2020   LDLDIRECT 104.0 07/05/2019   TRIG 77.0 02/28/2020   CHOLHDL 3 02/28/2020    Eye exam in done in 2019   HYPERTENSION: Her blood pressure  had been consistently high in the office  She has improved blood pressure with starting ramipril 2.5 mg daily  Blood pressure is higher than usual, may be anxious  However she has not checked her blood pressure at home even though she has a monitor  BP Readings from Last 3 Encounters:  06/08/20 118/82  03/01/20 138/78  07/07/19 140/80     EXAM:  BP 118/82   Pulse (!) 103   Ht 5\' 3"  (1.6 m)   Wt 191 lb 12.8 oz (87 kg)   SpO2 99%   BMI 33.98 kg/m     ASSESSMENT:  Diabetes 1 on insulin pump with inadequate control  See history of present illness for detailed discussion of  current management, blood sugar patterns and problems identified  A1c is only slightly higher at 7.6 More recently has had some benefit from Ozempic with weight loss, somewhat decreased blood sugars overall and better time in target No hypoglycemia Several readings after breakfast and lunch are related to late hour missed boluses and occasionally in the evening also                          RECOMMENDATIONS:  She needs to bolus before starting to eat consistently especially at suppertime  Additional 2 to 4 units for higher fat meals  Continue Ozempic 0.5  She will try carbohydrate coverage of 1: 7 at lunch but may also need to change it at breakfast  She will try to bolus in the morning on waking up with blood sugars are rising to take care of the dawn phenomenon  Likely needs to bolus up to 30 minutes before breakfast and at least 15 minutes before lunch, she thinks she can try to set up an  alarm on her watch to remind her   A1c will be checked always from [facility] because of falsely low readings from the in-house lab.    HYPERCHOLESTEROLEMIA : Will need follow-up on the next visit    HYPERTENSION: This is variably controlled and blood pressure is better controlled now, previously likely also has whitecoat syndrome  She will have her eye exam report sent when this is done today  Follow-up in 4 months  Patient Instructions  Take Bolus 15 min before meals  Carb ratio 1;7 AT LUNCH   Flu vaccine given  06/08/2020, 9:33 AM

## 2020-06-09 ENCOUNTER — Encounter: Payer: Self-pay | Admitting: *Deleted

## 2020-06-26 ENCOUNTER — Encounter: Payer: Self-pay | Admitting: Endocrinology

## 2020-07-08 ENCOUNTER — Encounter: Payer: Self-pay | Admitting: Endocrinology

## 2020-08-01 ENCOUNTER — Other Ambulatory Visit: Payer: Self-pay | Admitting: Endocrinology

## 2020-08-02 ENCOUNTER — Other Ambulatory Visit: Payer: Self-pay | Admitting: Endocrinology

## 2020-10-13 ENCOUNTER — Other Ambulatory Visit: Payer: Self-pay | Admitting: Endocrinology

## 2020-10-17 ENCOUNTER — Other Ambulatory Visit: Payer: BC Managed Care – PPO

## 2020-10-18 ENCOUNTER — Other Ambulatory Visit: Payer: BC Managed Care – PPO

## 2020-10-20 ENCOUNTER — Ambulatory Visit: Payer: BC Managed Care – PPO | Admitting: Endocrinology

## 2020-10-27 ENCOUNTER — Other Ambulatory Visit: Payer: Self-pay | Admitting: Endocrinology

## 2020-12-29 ENCOUNTER — Other Ambulatory Visit: Payer: BC Managed Care – PPO

## 2021-01-01 ENCOUNTER — Ambulatory Visit: Payer: BC Managed Care – PPO | Admitting: Endocrinology

## 2021-01-31 ENCOUNTER — Other Ambulatory Visit: Payer: Self-pay | Admitting: Endocrinology

## 2021-01-31 DIAGNOSIS — E1065 Type 1 diabetes mellitus with hyperglycemia: Secondary | ICD-10-CM

## 2021-02-23 NOTE — Addendum Note (Signed)
Addended by: Adline Mango I on: 02/23/2021 05:30 PM   Modules accepted: Orders

## 2021-02-27 ENCOUNTER — Other Ambulatory Visit (INDEPENDENT_AMBULATORY_CARE_PROVIDER_SITE_OTHER): Payer: BC Managed Care – PPO

## 2021-02-27 ENCOUNTER — Other Ambulatory Visit: Payer: Self-pay

## 2021-02-27 DIAGNOSIS — E1065 Type 1 diabetes mellitus with hyperglycemia: Secondary | ICD-10-CM | POA: Diagnosis not present

## 2021-02-27 DIAGNOSIS — E7800 Pure hypercholesterolemia, unspecified: Secondary | ICD-10-CM | POA: Diagnosis not present

## 2021-02-27 LAB — COMPREHENSIVE METABOLIC PANEL
ALT: 27 U/L (ref 0–35)
AST: 21 U/L (ref 0–37)
Albumin: 3.9 g/dL (ref 3.5–5.2)
Alkaline Phosphatase: 58 U/L (ref 39–117)
BUN: 14 mg/dL (ref 6–23)
CO2: 26 mEq/L (ref 19–32)
Calcium: 8.9 mg/dL (ref 8.4–10.5)
Chloride: 103 mEq/L (ref 96–112)
Creatinine, Ser: 0.9 mg/dL (ref 0.40–1.20)
GFR: 74.82 mL/min (ref >60.00)
Glucose, Bld: 155 mg/dL — ABNORMAL HIGH (ref 70–99)
Potassium: 4.2 mEq/L (ref 3.5–5.1)
Sodium: 138 mEq/L (ref 135–145)
Total Bilirubin: 0.5 mg/dL (ref 0.2–1.2)
Total Protein: 6.3 g/dL (ref 6.0–8.3)

## 2021-02-27 LAB — LIPID PANEL
Cholesterol: 191 mg/dL (ref 0–200)
HDL: 79.9 mg/dL (ref >39.00)
LDL Cholesterol: 100 mg/dL — ABNORMAL HIGH (ref 0–99)
NonHDL: 111.21
Total CHOL/HDL Ratio: 2
Triglycerides: 56 mg/dL (ref 0.0–149.0)
VLDL: 11.2 mg/dL (ref 0.0–40.0)

## 2021-02-28 LAB — HEMOGLOBIN A1C
Est. average glucose Bld gHb Est-mCnc: 177 mg/dL
Hgb A1c MFr Bld: 7.8 % — ABNORMAL HIGH (ref 4.8–5.6)

## 2021-03-02 ENCOUNTER — Ambulatory Visit: Payer: BC Managed Care – PPO | Admitting: Endocrinology

## 2021-03-02 ENCOUNTER — Encounter: Payer: Self-pay | Admitting: Endocrinology

## 2021-03-02 ENCOUNTER — Other Ambulatory Visit: Payer: Self-pay

## 2021-03-02 VITALS — BP 128/88 | HR 110 | Ht 63.0 in | Wt 189.0 lb

## 2021-03-02 DIAGNOSIS — E7800 Pure hypercholesterolemia, unspecified: Secondary | ICD-10-CM

## 2021-03-02 DIAGNOSIS — E1065 Type 1 diabetes mellitus with hyperglycemia: Secondary | ICD-10-CM

## 2021-03-02 NOTE — Patient Instructions (Addendum)
Take 0.25 1st 2 shots of Ozempic  Check BP  BOLUS BEFORE EACH MEAL, add 15-20g carb for hi fat meals

## 2021-03-02 NOTE — Progress Notes (Signed)
Patient ID: Tammy Vega, female   DOB: [birth year], 50 y.o.   MRN: [id number]   Reason for Appointment: Insulin Pump followup:   History of Present Illness   Diagnosis: Type 1 DIABETES MELITUS, date of diagnosis:2003     DIABETES history: She was initially diagnosed with diabetes during pregnancy and required insulin. She went on insulin again in 2004 and was started on the pump in 2005.  Previously her control has been variable with needing periodic adjustments and A1c around 6-7 She also tried Symlin 45 g in 2008-9 with some benefit in her postprandial regimen but she stopped since she could not keep up with this  CURRENT insulin pump:  Medtronic 670   PUMP SETTINGS:  BASAL: Midnight = 0.575, 3 AM = 0.7, 5 AM = 1.7, 7:30 AM = 1. 45, 9:30 AM = 1.55, 1 PM = 1.5, 4 PM = 0.7 and 7 PM = 1.8, 11 PM = 0.65  Carbohydrate ratio 1: 8  for lunch and supper and 1:15 for breakfast  Sensitivity 1:50, at 6 PM 1:40 with target 90-100 Active insulin time 3 hours  Her A1c is falsely low done in the [place] lab, is accurate done from [facility]  Current blood sugar patterns and problems identified are as follows:  A1c is slightly higher at 7.8  Blood sugar patterns from CGM analysis and day-to-day management of her boluses, diet and reasons for hyperglycemia were reviewed in detail  Overall time in target is significantly lower On the previous visit she had tried Ozempic which she thinks helped her manage her diet and postprandial blood sugars but she ran out of her prescription few weeks ago She appears to have persistently high readings most of the afternoons despite trying to bolus for meals and snacks Has had difficulty with lifestyle issues and family matters that keep her from consistently managing her diet and insulin May well be late for meal coverage with boluses at times but does not appear to have missed boluses except possibly in the evening at  times Overall postprandial rise in blood sugar is not excessive Only occasionally appears to have significant drop in her blood sugar with correction doses when she is in the manual mode Not doing much exercise However her weight is about the same as on her last visit in 11/21  Auto mode exits have been because of low calibration, high blood sugar and alarms, auto mode present 80% of the time  Her CGM for the last 2 weeks is on the guardian sensor is interpreted as follows:  Generally blood sugars are lower overnight and progressively higher from morning till afternoon and then on an average stay higher  Blood sugars start high at about 200 average overnight and then decrease to good levels with stable readings until about 7-8 AM when to start rising  PREMEAL readings are progressively higher from morning till evening on an average  POSTPRANDIAL readings on an average compared to Premeal readings are relatively about the same No hypoglycemia seen Rarely may have low normal sugars, generally more in the manual mode possibly from overcorrection Premeal blood sugars are higher at breakfast and lunch but at lunch mostly because of delayed boluses Overall rise in blood sugar after meals is not excessive Dawn phenomenon is present to a mild degree occasionally  CGM use % of time 87  2-week average/GV 77+/-55  Time in range      56  %  %  Time Above 180 33  % Time above 250 11  % Time Below 70      PRE-MEAL Fasting Lunch Dinner Bedtime Overall  Glucose range:       Averages: 176 195 239      POST-MEAL PC Breakfast PC Lunch PC Dinner  Glucose range:     Averages: 173 216 217   Prior   CGM use % of time  85  2-week average/SD  160+/-51  Time in range      68%  % Time Above 180  24  % Time above 250 7  % Time Below 70 1     PRE-MEAL  Premeal a.m. Lunch Dinner Bedtime Overall  Glucose range:       Averages:  171  192  155     POST-MEAL PC Breakfast PC Lunch PC Dinner  Glucose  range:      Averages: 231  213  180     Glycemic patterns summary: Blood sugars are rising progressively between waking up time and about 3 PM overall with postprandial spikes at lunchtime.  Also has mostly high readings in the evenings with some postprandial spikes also Overnight blood sugars are excellent with minimal variability    Wt Readings from Last 3 Encounters:  03/02/21 189 lb (85.7 kg)  06/08/20 191 lb 12.8 oz (87 kg)  03/01/20 204 lb 12.8 oz (92.9 kg)    Lab Results  Component Value Date   HGBA1C 7.8 (H) 02/27/2021   HGBA1C 7.6 (H) 06/02/2020   HGBA1C 7.4 (H) 02/28/2020   Lab Results  Component Value Date   MICROALBUR <0.7 02/28/2020   LDLCALC 100 (H) 02/27/2021   CREATININE 0.90 02/27/2021      Lab on 02/27/2021  Component Date Value Ref Range Status   Hgb A1c MFr Bld 02/27/2021 7.8 (A) 4.8 - 5.6 % Final   Comment:          Prediabetes: 5.7 - 6.4          Diabetes: >6.4          Glycemic control for adults with diabetes: <7.0    Est. average glucose Bld gHb Est-m* 02/27/2021 177  mg/dL Final   Cholesterol 27/12/2374 191  0 - 200 mg/dL Final   ATP III Classification       Desirable:  < 200 mg/dL               Borderline High:  200 - 239 mg/dL          High:  > = 283 mg/dL   Triglycerides 15/17/6160 56.0  0.0 - 149.0 mg/dL Final   Normal:  <737 mg/dLBorderline High:  150 - 199 mg/dL   HDL 10/62/6948 54.62  >39.00 mg/dL Final   VLDL 70/35/0093 11.2  0.0 - 40.0 mg/dL Final   LDL Cholesterol 02/27/2021 100 (A) 0 - 99 mg/dL Final   Total CHOL/HDL Ratio 02/27/2021 2   Final                  Men          Women1/2 Average Risk     3.4          3.3Average Risk          5.0          4.42X Average Risk          9.6          7.13X Average Risk  15.0          11.0                       NonHDL 02/27/2021 111.21   Final   NOTE:  Non-HDL goal should be 30 mg/dL higher than patient's LDL goal (i.e. LDL goal of < 70 mg/dL, would have non-HDL goal of < 100 mg/dL)    Sodium 16/10/960408/08/2020 540138  135 - 145 mEq/L Final   Potassium 02/27/2021 4.2  3.5 - 5.1 mEq/L Final   Chloride 02/27/2021 103  96 - 112 mEq/L Final   CO2 02/27/2021 26  19 - 32 mEq/L Final   Glucose, Bld 02/27/2021 155 (A) 70 - 99 mg/dL Final   BUN 98/11/914708/08/2020 14  6 - 23 mg/dL Final   Creatinine, Ser 02/27/2021 0.90  0.40 - 1.20 mg/dL Final   Total Bilirubin 02/27/2021 0.5  0.2 - 1.2 mg/dL Final   Alkaline Phosphatase 02/27/2021 58  39 - 117 U/L Final   AST 02/27/2021 21  0 - 37 U/L Final   ALT 02/27/2021 27  0 - 35 U/L Final   Total Protein 02/27/2021 6.3  6.0 - 8.3 g/dL Final   Albumin 82/95/621308/08/2020 3.9  3.5 - 5.2 g/dL Final   GFR 08/65/784608/08/2020 74.82  >60.00 mL/min Final   Calculated using the CKD-EPI Creatinine Equation (2021)   Calcium 02/27/2021 8.9  8.4 - 10.5 mg/dL Final    Allergies as of 03/02/2021   No Known Allergies      Medication List        Accurate as of March 02, 2021 11:03 AM. If you have any questions, ask your nurse or doctor.          Contour Next Test test strip Generic drug: glucose blood TEST BLOOD SUGAR 8 TIMES DAILY AS DIRECTED   ibuprofen 200 MG tablet Commonly known as: ADVIL Take 200 mg by mouth every 6 (six) hours as needed for pain (PRN).   INSULIN SYRINGE .5CC/31GX5/16" 31G X 5/16" 0.5 ML Misc 1 each by Does not apply route daily.   levonorgestrel 20 MCG/24HR IUD Commonly known as: MIRENA 1 each by Intrauterine route once.   NovoLOG 100 UNIT/ML injection Generic drug: insulin aspart USE MAX OF 70 UNITS DAILY VIA INSULIN PUMP.   Ozempic (0.25 or 0.5 MG/DOSE) 2 MG/1.5ML Sopn Generic drug: Semaglutide(0.25 or 0.5MG /DOS) INJECT 0.5 MG INTO THE SKIN ONCE A WEEK.   ramipril 2.5 MG capsule Commonly known as: ALTACE TAKE 1 CAPSULE BY MOUTH EVERY DAY   rosuvastatin 20 MG tablet Commonly known as: CRESTOR TAKE 1 TABLET BY MOUTH EVERY DAY        Allergies: No Known Allergies  No past medical history on file.  No past surgical history on  file.  Family History  Problem Relation Age of Onset   Hyperlipidemia Mother    Diabetes Neg Hx    Heart disease Neg Hx     Social History:  reports that she has never smoked. She has never used smokeless tobacco. No history on file for alcohol use and drug use.  REVIEW of systems:  She has had hypercholesterolemia and Had been on Zocor for a few years With Crestor her LDL previously was controlled but now it is borderline  She has taken her Crestor regularly but may be eating out more and getting higher fat meals   Lab Results  Component Value Date   CHOL 191 02/27/2021   HDL 79.90 02/27/2021  LDLCALC 100 (H) 02/27/2021   LDLDIRECT 104.0 07/05/2019   TRIG 56.0 02/27/2021   CHOLHDL 2 02/27/2021      HYPERTENSION: Her blood pressure  had been high in the office  She has improved blood pressure with ramipril 2.5 mg daily  Blood pressure is higher at times with some whitecoat syndrome  However she has not checked her blood pressure at home even though she has a monitor  BP Readings from Last 3 Encounters:  03/02/21 128/88  06/08/20 118/82  03/01/20 138/78     EXAM:  BP 128/88 (BP Location: Right Arm, Patient Position: Sitting, Cuff Size: Normal)   Pulse (!) 110   Ht 5\' 3"  (1.6 m)   Wt 189 lb (85.7 kg)   SpO2 97%   BMI 33.48 kg/m     ASSESSMENT:  Diabetes 1 on insulin pump with inadequate control  See history of present illness for detailed discussion of  current management, blood sugar patterns and problems identified  A1c is again slightly higher at 7.8 compared to 7.6 She thinks that she has not been able to focus on her meals especially with family issues, eating out and late or missed boluses Blood sugars appear to be on an average consistently high midday and afternoon until late evening despite being in the auto mode 90% of the time  Also she may have benefited from Ozempic which she is currently not taking                         RECOMMENDATIONS: She needs to bolus before starting to eat consistently at all meals especially dinnertime Additional 2 to 4 units for higher fat meals Restart Ozempic 0.25 and then 0.5 after 2 weeks She will change her carbohydrate coverage of 1: 7 at lunch, 1: 8 at dinner and 1: 9:10 PM  Sensitivity 1: 35 during the day  She will try to see if Lyumjev is covered by her new insurance For her manual basal rates she will reduce 1 PM basal rate to 1.5 and 4 PM basal rate to 1.6   A1c will be checked always from [facility] because of falsely low readings from the in-house lab.    HYPERCHOLESTEROLEMIA : Will need to have her try and get back onto a lower fat and lower saturated fat intake   HYPERTENSION mild: This is variably controlled and blood pressure is slightly higher today, likely also has whitecoat syndrome  Follow-up in 3 months She will try to establish with a PCP  There are no Patient Instructions on file for this visit.    03/02/2021, 11:03 AM

## 2021-04-06 ENCOUNTER — Other Ambulatory Visit: Payer: Self-pay | Admitting: Endocrinology

## 2021-04-09 ENCOUNTER — Other Ambulatory Visit: Payer: Self-pay | Admitting: Endocrinology

## 2021-04-09 ENCOUNTER — Telehealth: Payer: Self-pay | Admitting: Pharmacy Technician

## 2021-04-09 ENCOUNTER — Telehealth: Payer: Self-pay

## 2021-04-09 ENCOUNTER — Other Ambulatory Visit (HOSPITAL_COMMUNITY): Payer: Self-pay

## 2021-04-09 NOTE — Telephone Encounter (Signed)
Spoke with Kathie Rhodes she will start PA for medication.

## 2021-04-09 NOTE — Telephone Encounter (Addendum)
.  Patient Advocate Encounter   Received notification from PATIENT that prior authorization for [facility] is required.   PA submitted on 04/09/2021 Key  [id number]-follow up clinical questions submitted 04/12/2021  Decision will take 24-72 hours. Case ID: [id number] Status is DENIED  Will not approved because not Type II diabetic.    [facility] will continue to follow   Jeannette How, CPhT Patient Advocate [facility] Phone: [phone] Fax:  [phone]

## 2021-04-09 NOTE — Telephone Encounter (Signed)
error 

## 2021-04-23 NOTE — Telephone Encounter (Signed)
Called patient no answer. Left vm to call me back.

## 2021-04-30 ENCOUNTER — Other Ambulatory Visit: Payer: Self-pay | Admitting: Endocrinology

## 2021-04-30 DIAGNOSIS — E1065 Type 1 diabetes mellitus with hyperglycemia: Secondary | ICD-10-CM

## 2021-05-03 ENCOUNTER — Telehealth: Payer: Self-pay | Admitting: Pharmacy Technician

## 2021-05-03 NOTE — Telephone Encounter (Signed)
Patient Advocate Encounter   Received notification from [employer] that prior authorization for NOVOLOG is required.   PA submitted on 05/03/21 Key  [id number] Status is pending    [facility] will continue to follow   Montez Morita, CPhT Patient Advocate [facility] Phone: [phone] Fax:  [phone]

## 2021-05-09 MED ORDER — LYUMJEV 100 UNIT/ML IJ SOLN: INTRAMUSCULAR | 2 refills | Status: DC

## 2021-05-09 NOTE — Telephone Encounter (Signed)
Received a fax regarding Prior Authorization from [employer] for NOVOLOG. Authorization has been DENIED because MUST TRY ONE OF THE FOLLOWING ALTERNATIVES: HUMALOG OR LYUMJEV.

## 2021-05-09 NOTE — Telephone Encounter (Signed)
Rx sent to preferred pharmacy.

## 2021-05-09 NOTE — Addendum Note (Signed)
Addended by: Eliseo Squires on: 05/09/2021 03:25 PM   Modules accepted: Orders

## 2021-05-22 ENCOUNTER — Telehealth: Payer: Self-pay | Admitting: Endocrinology

## 2021-05-22 NOTE — Telephone Encounter (Signed)
Patient called to advise that her insurance changed to [place] from [facility] - Patient advises that because of this she has been advised by Felisa Bonier that her supplies for her  Guardian Sensor & Medtronic Pump Supplies with have to have a prior authorization

## 2021-05-22 NOTE — Telephone Encounter (Signed)
Patient's new insurance is:  Rosann Auerbach member ID [id number]  Group ID [id number] Provider# [phone]  Patient asked to upload front and back in [place] card via My Chart

## 2021-05-28 NOTE — Telephone Encounter (Signed)
Pt contacted and advised [facility] needs to send over any paperwork that needs to be completed. Once received we will fax over all required paperwork to [facility] to process her order.

## 2021-05-29 ENCOUNTER — Other Ambulatory Visit: Payer: Self-pay | Admitting: Endocrinology

## 2021-08-03 ENCOUNTER — Other Ambulatory Visit: Payer: Self-pay | Admitting: Endocrinology

## 2021-08-03 DIAGNOSIS — E1065 Type 1 diabetes mellitus with hyperglycemia: Secondary | ICD-10-CM

## 2021-09-04 ENCOUNTER — Other Ambulatory Visit: Payer: Self-pay | Admitting: Endocrinology

## 2021-09-18 ENCOUNTER — Telehealth: Payer: Self-pay

## 2021-09-18 NOTE — Telephone Encounter (Signed)
[facility] called requesting chart notes be sent to [phone]. Chart notes sent electronically

## 2021-09-26 ENCOUNTER — Telehealth: Payer: Self-pay | Admitting: Nutrition

## 2021-09-26 NOTE — Telephone Encounter (Signed)
Patient given samples of the new Medtronic Mio advance, because she was running low, before her order could be shipped.  Lot: [id number],  Exp. 9/24 ?

## 2021-10-11 ENCOUNTER — Other Ambulatory Visit (INDEPENDENT_AMBULATORY_CARE_PROVIDER_SITE_OTHER): Payer: Managed Care, Other (non HMO)

## 2021-10-11 ENCOUNTER — Other Ambulatory Visit: Payer: Self-pay

## 2021-10-11 ENCOUNTER — Other Ambulatory Visit (HOSPITAL_COMMUNITY): Payer: Self-pay

## 2021-10-11 DIAGNOSIS — E1065 Type 1 diabetes mellitus with hyperglycemia: Secondary | ICD-10-CM

## 2021-10-11 DIAGNOSIS — E7800 Pure hypercholesterolemia, unspecified: Secondary | ICD-10-CM

## 2021-10-11 LAB — COMPREHENSIVE METABOLIC PANEL
ALT: 12 U/L (ref 0–35)
AST: 13 U/L (ref 0–37)
Albumin: 4.1 g/dL (ref 3.5–5.2)
Alkaline Phosphatase: 50 U/L (ref 39–117)
BUN: 13 mg/dL (ref 6–23)
CO2: 27 mEq/L (ref 19–32)
Calcium: 9 mg/dL (ref 8.4–10.5)
Chloride: 104 mEq/L (ref 96–112)
Creatinine, Ser: 0.88 mg/dL (ref 0.40–1.20)
GFR: 76.54 mL/min (ref >60.00)
Glucose, Bld: 75 mg/dL (ref 70–99)
Potassium: 4.3 mEq/L (ref 3.5–5.1)
Sodium: 137 mEq/L (ref 135–145)
Total Bilirubin: 0.4 mg/dL (ref 0.2–1.2)
Total Protein: 6 g/dL (ref 6.0–8.3)

## 2021-10-11 LAB — MICROALBUMIN / CREATININE URINE RATIO
Creatinine,U: 78.4 mg/dL
Microalb Creat Ratio: 0.9 mg/g (ref 0.0–30.0)
Microalb, Ur: <0.7 mg/dL (ref 0.0–1.9)

## 2021-10-11 LAB — LIPID PANEL
Cholesterol: 177 mg/dL (ref 0–200)
HDL: 69.2 mg/dL (ref >39.00)
LDL Cholesterol: 96 mg/dL (ref 0–99)
NonHDL: 107.83
Total CHOL/HDL Ratio: 3
Triglycerides: 58 mg/dL (ref 0.0–149.0)
VLDL: 11.6 mg/dL (ref 0.0–40.0)

## 2021-10-11 MED ORDER — DEXCOM G6 TRANSMITTER MISC: 2 refills | Status: DC

## 2021-10-11 MED ORDER — DEXCOM G6 SENSOR MISC: 2 refills | Status: DC

## 2021-10-11 NOTE — Telephone Encounter (Signed)
Patient called in states she needs sensors for her medtronic minimed (guardian 3 sensors). Are you all able to start PA for this? ?

## 2021-10-11 NOTE — Telephone Encounter (Signed)
Attempted to call patient twice and no answer. Voicemail left to call back ?

## 2021-10-11 NOTE — Addendum Note (Signed)
Addended by: Eliseo Squires on: 10/11/2021 04:07 PM ? ? Modules accepted: Orders ? ?

## 2021-10-12 LAB — HEMOGLOBIN A1C
Est. average glucose Bld gHb Est-mCnc: 174 mg/dL
Hgb A1c MFr Bld: 7.7 % — ABNORMAL HIGH (ref 4.8–5.6)

## 2021-10-15 ENCOUNTER — Encounter: Payer: Managed Care, Other (non HMO) | Admitting: Endocrinology

## 2021-10-15 ENCOUNTER — Encounter: Payer: Self-pay | Admitting: Endocrinology

## 2021-10-15 ENCOUNTER — Other Ambulatory Visit: Payer: Self-pay

## 2021-10-15 NOTE — Progress Notes (Signed)
This encounter was created in error - please disregard.

## 2021-10-26 ENCOUNTER — Other Ambulatory Visit (HOSPITAL_COMMUNITY): Payer: Self-pay

## 2021-10-30 ENCOUNTER — Other Ambulatory Visit: Payer: Self-pay | Admitting: Endocrinology

## 2021-10-30 ENCOUNTER — Telehealth: Payer: Self-pay

## 2021-10-30 DIAGNOSIS — E1065 Type 1 diabetes mellitus with hyperglycemia: Secondary | ICD-10-CM

## 2021-10-30 NOTE — Telephone Encounter (Signed)
Medtronic called in wanting to know status of chart notes being sent back. Informed chart notes were sent back yesterday. ?

## 2021-11-01 ENCOUNTER — Ambulatory Visit: Payer: Managed Care, Other (non HMO) | Admitting: Endocrinology

## 2021-11-15 ENCOUNTER — Ambulatory Visit: Payer: Managed Care, Other (non HMO) | Admitting: Endocrinology

## 2021-11-15 ENCOUNTER — Encounter: Payer: Self-pay | Admitting: Endocrinology

## 2021-11-15 VITALS — BP 142/98 | HR 72 | Ht 62.0 in | Wt 193.8 lb

## 2021-11-15 DIAGNOSIS — I10 Essential (primary) hypertension: Secondary | ICD-10-CM | POA: Diagnosis not present

## 2021-11-15 DIAGNOSIS — E7800 Pure hypercholesterolemia, unspecified: Secondary | ICD-10-CM

## 2021-11-15 DIAGNOSIS — E1065 Type 1 diabetes mellitus with hyperglycemia: Secondary | ICD-10-CM

## 2021-11-15 NOTE — Progress Notes (Signed)
? ?       ?Patient ID: Tammy AntiguaPatricia W Vega, female   DOB: Dec 22, 1970, 51 y.o.   MRN: [id number] ? ? ?Reason for Appointment: Insulin Pump followup:  ? ?History of Present Illness  ? ?Diagnosis: Type 1 DIABETES MELITUS, date of diagnosis:2003    ? ?DIABETES history: She was initially diagnosed with diabetes during pregnancy and required insulin. She went on insulin again in 2004 and was started on the pump in 2005.  ?Previously her control has been variable with needing periodic adjustments and A1c around 6-7 ?She also tried Symlin 45 ?g in 2008-9 with some benefit in her postprandial regimen but she stopped since she could not keep up with this ? ?CURRENT insulin pump:  Medtronic 670 ? ? ?PUMP SETTINGS: ? ?BASAL: Midnight = 0.575, 3 AM = 0.7, 5 AM = 1.7, 7:30 AM = 1. 45, 9:30 AM = 1.55, 1 PM = 1.5, 4 PM = 0.7 and 7 PM = 1.8, 11 PM = 0.65 ? ?Carbohydrate ratio 1: 8  for lunch and supper and 1:15 for breakfast  ?Sensitivity 1:50, at 6 PM 1:40 with target 90-100 ?Active insulin time 3 hours ? ?Her A1c is falsely low done in the [facility], is accurate done from [facility] ? ?Recent blood sugar patterns from pump/Guardian CGM download and problems identified are as follows: ? ?A1c is about the same at 7.7 ? ? ?Overall time in target is about the same as on the last visit  ?She says because of personal issues and family commitments she is not able to focus on her diabetes management including diet, bolusing and exercise  ?Also despite using Ozempic she has not lost any weight ?Her blood sugars are progressively higher from morning till early afternoon from inadequate and late bolusing ?Patterns are discussed below in CGM interpretation ?She also has a dawn phenomenon or increasing blood sugar in the morning with drinking coffee which she is not covering as discussed before ?Also likely not planning her meals well especially when not eating at home ?Overall blood sugar variability is also  significant ?Currently she is having inconsistent coverage of her pump supplies and not clear if she wants to change to another pump ?No hypoglycemia ? ?Auto mode exits have been occurring mostly because of high SG as well as low calibration and no ST values ? ? ?Her CGM for the last 2 weeks with the guardian sensor is interpreted as follows: ? ?Highest blood sugars are in the early afternoon ?Blood sugars are the lowest early morning  ?OVERNIGHT blood sugars initially are averaging 180 at midnight and then progressively decreasing to near 100 before starting to rise around 6 AM  ?PREMEAL readings are significantly high at lunch and also at dinner only on average ?Blood sugars appear to be rising before bolus is effective at dinnertime  ?Blood sugars after meals are quite variable with POSTPRANDIAL readings mostly level compared to Premeal readings ?Blood sugars at 3 hours are not unusually higher or lower compared to 2 hours after eating  ?Blood sugars are more significantly variable late morning and mid afternoon ?Dawn phenomenon is present to a mild degree at times ? ?CGM use % of time 81  ?2-week average/GV 173+/-63  ?Time in range        60  ?% Time Above 180 28+12  ?% Time above 250   ?% Time Below 70 0  ? ?  ?PRE-MEAL Fasting Lunch Dinner Bedtime Overall  ?Glucose range:       ?  Averages: 189 229 212    ? ?POST-MEAL PC Breakfast PC Lunch PC Dinner  ?Glucose range:     ?Averages: 164 255 220  ? ? ?Previously: ? ?CGM use % of time 87  ?2-week average/GV 77+/-55  ?Time in range      56  %  ?% Time Above 180 33  ?% Time above 250 11  ?% Time Below 70   ? ?  ?PRE-MEAL Fasting Lunch Dinner Bedtime Overall  ?Glucose range:       ?Averages: 176 195 239    ?  ?POST-MEAL PC Breakfast PC Lunch PC Dinner  ?Glucose range:     ?Averages: 173 216 217  ? ? ?Wt Readings from Last 3 Encounters:  ?11/15/21 193 lb 12.8 oz (87.9 kg)  ?10/15/21 192 lb 12.8 oz (87.5 kg)  ?03/02/21 189 lb (85.7 kg)  ? ? ?Lab Results  ?Component Value  Date  ? HGBA1C 7.7 (H) 10/11/2021  ? HGBA1C 7.8 (H) 02/27/2021  ? HGBA1C 7.6 (H) 06/02/2020  ? ?Lab Results  ?Component Value Date  ? MICROALBUR <0.7 10/11/2021  ? LDLCALC 96 10/11/2021  ? CREATININE 0.88 10/11/2021  ? ? ? ? ?No visits with results within 1 Week(s) from this visit.  ?Latest known visit with results is:  ?Lab on 10/11/2021  ?Component Date Value Ref Range Status  ? Cholesterol 10/11/2021 177  0 - 200 mg/dL Final  ? ATP III Classification       Desirable:  < 200 mg/dL               Borderline High:  200 - 239 mg/dL          High:  > = 637 mg/dL  ? Triglycerides 10/11/2021 58.0  0.0 - 149.0 mg/dL Final  ? Normal:  <858 mg/dLBorderline High:  150 - 199 mg/dL  ? HDL 10/11/2021 69.20  >39.00 mg/dL Final  ? VLDL 85/08/7739 11.6  0.0 - 40.0 mg/dL Final  ? LDL Cholesterol 10/11/2021 96  0 - 99 mg/dL Final  ? Total CHOL/HDL Ratio 10/11/2021 3   Final  ?                Men          Women1/2 Average Risk     3.4          3.3Average Risk          5.0          4.42X Average Risk          9.6          7.13X Average Risk          15.0          11.0                      ? NonHDL 10/11/2021 107.83   Final  ? NOTE:  Non-HDL goal should be 30 mg/dL higher than patient's LDL goal (i.e. LDL goal of < 70 mg/dL, would have non-HDL goal of < 100 mg/dL)  ? Microalb, Ur 10/11/2021 <0.7  0.0 - 1.9 mg/dL Final  ? Creatinine,U 28/78/6767 78.4  mg/dL Final  ? Microalb Creat Ratio 10/11/2021 0.9  0.0 - 30.0 mg/g Final  ? Sodium 10/11/2021 137  135 - 145 mEq/L Final  ? Potassium 10/11/2021 4.3  3.5 - 5.1 mEq/L Final  ? Chloride 10/11/2021 104  96 - 112 mEq/L Final  ? CO2 10/11/2021 27  19 - 32 mEq/L Final  ? Glucose, Bld 10/11/2021 75  70 - 99 mg/dL Final  ? BUN 57/26/2035 13  6 - 23 mg/dL Final  ? Creatinine, Ser 10/11/2021 0.88  0.40 - 1.20 mg/dL Final  ? Total Bilirubin 10/11/2021 0.4  0.2 - 1.2 mg/dL Final  ? Alkaline Phosphatase 10/11/2021 50  39 - 117 U/L Final  ? AST 10/11/2021 13  0 - 37 U/L Final  ? ALT 10/11/2021 12  0 -  35 U/L Final  ? Total Protein 10/11/2021 6.0  6.0 - 8.3 g/dL Final  ? Albumin 59/74/1638 4.1  3.5 - 5.2 g/dL Final  ? GFR 45/36/4680 76.54  >60.00 mL/min Final  ? Calculated using the CKD-EPI Creatinine Equation (2021)  ? Calcium 10/11/2021 9.0  8.4 - 10.5 mg/dL Final  ? Hgb H2Z MFr Bld 10/11/2021 7.7 (H)  4.8 - 5.6 % Final  ? Comment:          Prediabetes: 5.7 - 6.4 ?         Diabetes: >6.4 ?         Glycemic control for adults with diabetes: <7.0 ?  ? Est. average glucose Bld gHb Est-m* 10/11/2021 174  mg/dL Final  ? ? ?Allergies as of 11/15/2021   ?No Known Allergies ?  ? ?  ?Medication List  ?  ? ?  ? Accurate as of November 15, 2021 11:59 PM. If you have any questions, ask your nurse or doctor.  ?  ?  ? ?  ? ?Contour Next Test test strip ?Generic drug: glucose blood ?TEST BLOOD SUGAR 8 TIMES DAILY AS DIRECTED ?  ?Dexcom G6 Sensor Misc ?Change every 10 days ?  ?Dexcom G6 Transmitter Misc ?Change every 90 days ?  ?ibuprofen 200 MG tablet ?Commonly known as: ADVIL ?Take 200 mg by mouth every 6 (six) hours as needed for pain (PRN). ?  ?INSULIN SYRINGE .5CC/31GX5/16" 31G X 5/16" 0.5 ML Misc ?1 each by Does not apply route daily. ?  ?levonorgestrel 20 MCG/24HR IUD ?Commonly known as: MIRENA ?1 each by Intrauterine route once. ?  ?Lyumjev 100 UNIT/ML Soln ?Generic drug: Insulin Lispro-aabc ?USE MAX OF 70 UNITS DAILY VIA INSULIN PUMP ?  ?Ozempic (0.25 or 0.5 MG/DOSE) 2 MG/1.5ML Sopn ?Generic drug: Semaglutide(0.25 or 0.5MG /DOS) ?INJECT 0.5 MG INTO THE SKIN ONCE A WEEK. ?  ?ramipril 5 MG capsule ?Commonly known as: ALTACE ?Take 5 mg by mouth daily. ?  ?rosuvastatin 20 MG tablet ?Commonly known as: CRESTOR ?TAKE 1 TABLET BY MOUTH EVERY DAY ?  ? ?  ? ? ?Allergies: No Known Allergies ? ?No past medical history on file. ? ?No past surgical history on file. ? ?Family History  ?Problem Relation Age of Onset  ? Hyperlipidemia Mother   ? Diabetes Neg Hx   ? Heart disease Neg Hx   ? ? ?Social History:  reports that she has never  smoked. She has never used smokeless tobacco. No history on file for alcohol use and drug use. ? ?REVIEW of systems: ? ?She has had hypercholesterolemia, longstanding ?With Crestor her LDL is very well controlled but bel

## 2021-11-15 NOTE — Patient Instructions (Signed)
3 units for am coffee and cover all snacks ? ?Check BP 2x per week ?

## 2021-11-21 NOTE — Telephone Encounter (Signed)
Received another request from [employer]tr[employer] for chart notes. Refaxed again today and fax confirmed receiving ?

## 2021-12-04 ENCOUNTER — Telehealth: Payer: Self-pay

## 2021-12-04 ENCOUNTER — Other Ambulatory Visit (HOSPITAL_COMMUNITY): Payer: Self-pay

## 2021-12-04 NOTE — Telephone Encounter (Signed)
If the supplies being referred to are the Dexcom products, then as long as the patient's insurance did not change, patient can get it filled with either supplier. A new script would have to be sent to [employer]. Prior [employer] does not assign where the item may be filled. It is patient preference and where their insurance allows. I don't see where a PA was initiated but where the CGM was changed to Dexcom. Test billing results came back $35 per month for sensors or $105 for 90 day supply.

## 2021-12-04 NOTE — Telephone Encounter (Signed)
[employer] called in and stated that patient was approved for supplies but through [place] and they need PA redone through [employer]. Im not sure if patient has to be contacted first or not. Please follow up with patient if so. I did try to reach out to patient but no answer. ?

## 2021-12-13 NOTE — Telephone Encounter (Signed)
Tried calling patient but no answer.

## 2021-12-17 ENCOUNTER — Encounter: Payer: Self-pay | Admitting: Endocrinology

## 2021-12-22 ENCOUNTER — Other Ambulatory Visit: Payer: Self-pay | Admitting: Endocrinology

## 2021-12-26 ENCOUNTER — Other Ambulatory Visit (HOSPITAL_COMMUNITY): Payer: Self-pay

## 2021-12-27 ENCOUNTER — Other Ambulatory Visit (HOSPITAL_COMMUNITY): Payer: Self-pay

## 2021-12-31 ENCOUNTER — Other Ambulatory Visit (HOSPITAL_COMMUNITY): Payer: Self-pay

## 2022-02-07 ENCOUNTER — Other Ambulatory Visit: Payer: Self-pay | Admitting: Endocrinology

## 2022-02-07 DIAGNOSIS — E1065 Type 1 diabetes mellitus with hyperglycemia: Secondary | ICD-10-CM

## 2022-03-05 ENCOUNTER — Other Ambulatory Visit: Payer: Managed Care, Other (non HMO)

## 2022-03-08 ENCOUNTER — Ambulatory Visit: Payer: Managed Care, Other (non HMO) | Admitting: Endocrinology

## 2022-03-22 ENCOUNTER — Telehealth: Payer: Self-pay

## 2022-03-22 NOTE — Telephone Encounter (Signed)
Chart notes faxed to [facility] as requested. Sent to [phone]

## 2022-05-10 ENCOUNTER — Other Ambulatory Visit: Payer: Self-pay | Admitting: Endocrinology

## 2022-05-10 DIAGNOSIS — E1065 Type 1 diabetes mellitus with hyperglycemia: Secondary | ICD-10-CM

## 2022-05-27 ENCOUNTER — Other Ambulatory Visit: Payer: Managed Care, Other (non HMO)

## 2022-05-28 ENCOUNTER — Other Ambulatory Visit: Payer: Self-pay | Admitting: Endocrinology

## 2022-05-28 DIAGNOSIS — E1065 Type 1 diabetes mellitus with hyperglycemia: Secondary | ICD-10-CM

## 2022-05-30 ENCOUNTER — Ambulatory Visit: Payer: Managed Care, Other (non HMO) | Admitting: Endocrinology

## 2022-07-11 ENCOUNTER — Other Ambulatory Visit (INDEPENDENT_AMBULATORY_CARE_PROVIDER_SITE_OTHER): Payer: Managed Care, Other (non HMO)

## 2022-07-11 ENCOUNTER — Other Ambulatory Visit: Payer: Managed Care, Other (non HMO)

## 2022-07-11 DIAGNOSIS — E1065 Type 1 diabetes mellitus with hyperglycemia: Secondary | ICD-10-CM | POA: Diagnosis not present

## 2022-07-11 DIAGNOSIS — E7800 Pure hypercholesterolemia, unspecified: Secondary | ICD-10-CM | POA: Diagnosis not present

## 2022-07-11 DIAGNOSIS — R7309 Other abnormal glucose: Secondary | ICD-10-CM

## 2022-07-11 LAB — LIPID PANEL
Cholesterol: 200 mg/dL (ref 0–200)
HDL: 74.1 mg/dL (ref >39.00)
LDL Cholesterol: 116 mg/dL — ABNORMAL HIGH (ref 0–99)
NonHDL: 126.18
Total CHOL/HDL Ratio: 3
Triglycerides: 53 mg/dL (ref 0.0–149.0)
VLDL: 10.6 mg/dL (ref 0.0–40.0)

## 2022-07-11 LAB — BASIC METABOLIC PANEL
BUN: 11 mg/dL (ref 6–23)
CO2: 26 mEq/L (ref 19–32)
Calcium: 9.4 mg/dL (ref 8.4–10.5)
Chloride: 104 mEq/L (ref 96–112)
Creatinine, Ser: 0.94 mg/dL (ref 0.40–1.20)
GFR: 70.34 mL/min (ref >60.00)
Glucose, Bld: 122 mg/dL — ABNORMAL HIGH (ref 70–99)
Potassium: 4.6 mEq/L (ref 3.5–5.1)
Sodium: 139 mEq/L (ref 135–145)

## 2022-07-12 LAB — HEMOGLOBIN A1C
Est. average glucose Bld gHb Est-mCnc: 169 mg/dL
Hgb A1c MFr Bld: 7.5 % — ABNORMAL HIGH (ref 4.8–5.6)

## 2022-07-15 ENCOUNTER — Other Ambulatory Visit: Payer: Managed Care, Other (non HMO)

## 2022-07-18 ENCOUNTER — Ambulatory Visit (INDEPENDENT_AMBULATORY_CARE_PROVIDER_SITE_OTHER): Payer: Managed Care, Other (non HMO) | Admitting: Endocrinology

## 2022-07-18 ENCOUNTER — Encounter: Payer: Self-pay | Admitting: Endocrinology

## 2022-07-18 VITALS — BP 142/88 | HR 113 | Ht 63.0 in | Wt 199.0 lb

## 2022-07-18 DIAGNOSIS — I10 Essential (primary) hypertension: Secondary | ICD-10-CM

## 2022-07-18 DIAGNOSIS — E1065 Type 1 diabetes mellitus with hyperglycemia: Secondary | ICD-10-CM

## 2022-07-18 DIAGNOSIS — E7800 Pure hypercholesterolemia, unspecified: Secondary | ICD-10-CM | POA: Diagnosis not present

## 2022-07-18 MED ORDER — MINIMED MIO ADVANCE INFUSE SET MISC: 3 refills | Status: AC

## 2022-07-18 NOTE — Progress Notes (Signed)
Patient ID: Tammy Vega, female   DOB: 09-02-1970, 51 y.o.   MRN: [id number]   Reason for Appointment: Endocrinology followup:   History of Present Illness   Diagnosis: Type 1 DIABETES MELITUS, date of diagnosis:2003     DIABETES history: She was initially diagnosed with diabetes during pregnancy and required insulin. She went on insulin again in 2004 and was started on the pump in 2005.  Previously her control has been variable with needing periodic adjustments and A1c around 6-7 She also tried Symlin 45 g in 2008-9 with some benefit in her postprandial regimen but she stopped since she could not keep up with this  CURRENT insulin pump:  Medtronic 780   PUMP SETTINGS:  BASAL: Midnight = 0.575, 3 AM = 0.7, 5 AM = 1.7, 7:30 AM = 1. 45, 9:30 AM = 1.55, 1 PM = 1.5, 4 PM = 0.7 and 7 PM = 1.8, 11 PM = 0.65  Carbohydrate ratio 1: 8  for lunch and supper and 1:15 for breakfast  Sensitivity 1:50, at 6 PM 1:40 with target 90-100 Active insulin time 3 hours  Her A1c is falsely low done in the [place] lab, is accurate done from [facility]  Recent blood sugar patterns from pump/Guardian CGM download and problems identified are as follows:  A1c is about the same at 7.5 Currently using Lyumjev insulin  He has used the 780 G insulin pumps since about 03/2022 However although timing ranges somewhat better it is still below 70% As before she has difficulty with postprandial hyperglycemia but appears to be on an average better than on her last visit, likely from the additional boluses of Dilaudid by the pump Also timing of her insulin boluses are generally later than required for adequate control With this her Premeal blood sugars tend to be higher  Currently not exercising Recently also with periodically eating out she is likely getting high glycemic index foods or high fat intake causing periodic readings as much is 300 postprandially  Overall has been able to  keep her smart card with 93% of the time She has not continued Ozempic as it has not been covered by her insurance However her weight is up about 6 pounds and she is slightly less satiety  No hypoglycemia generally  Auto mode exits have been occurring mostly because of high SG as well as low calibration and no ST values   Her CGM for the last 2 weeks with the guardian 4 sensor is interpreted as follows:  Highest blood sugars are in the evening after 8 PM when compared to afternoon on her last visit Although on an average her blood sugars are not higher at 2 hours compared to Premeal average she is still having periodic significant spikes postprandially both at lunch and dinner This is variable from day-to-day  Overnight blood sugars as before well-controlled in the early morning and decline gradually after bedtime  Blood sugars are the lowest early morning near target by about 6 AM She is generally not eating breakfast but may have a rise in blood sugar in the morning partly from coffee which appears to be variably covered by boluses Dawn phenomenon is present as before  CGM use % of time   2-week average/sd 163/52, was 173/63  Time in range        69%  % Time Above 180 24  % Time above 250 6  % Time Below 70 1  PRE-MEAL Fasting Lunch Dinner Bedtime Overall  Glucose range:       Averages:  182 198     POST-MEAL PC Breakfast PC Lunch PC Dinner  Glucose range:     Averages: 142 193 213   Previously:  CGM use % of time 81  2-week average/GV 173+/-63  Time in range        60  % Time Above 180 28+12  % Time above 250   % Time Below 70 0     PRE-MEAL Fasting Lunch Dinner Bedtime Overall  Glucose range:       Averages: 189 229 212     POST-MEAL PC Breakfast PC Lunch PC Dinner  Glucose range:     Averages: 164 255 220    Previously:  CGM use % of time 87  2-week average/GV 77+/-55  Time in range      56  %  % Time Above 180 33  % Time above 250 11  % Time Below  70      PRE-MEAL Fasting Lunch Dinner Bedtime Overall  Glucose range:       Averages: 176 195 239      POST-MEAL PC Breakfast PC Lunch PC Dinner  Glucose range:     Averages: 173 216 217    Wt Readings from Last 3 Encounters:  07/18/22 199 lb (90.3 kg)  11/15/21 193 lb 12.8 oz (87.9 kg)  10/15/21 192 lb 12.8 oz (87.5 kg)    Lab Results  Component Value Date   HGBA1C 7.5 (H) 07/11/2022   HGBA1C 7.7 (H) 10/11/2021   HGBA1C 7.8 (H) 02/27/2021   Lab Results  Component Value Date   MICROALBUR <0.7 10/11/2021   LDLCALC 116 (H) 07/11/2022   CREATININE 0.94 07/11/2022      No visits with results within 1 Week(s) from this visit.  Latest known visit with results is:  Lab on 07/11/2022  Component Date Value Ref Range Status   Hgb A1c MFr Bld 07/11/2022 7.5 (H)  4.8 - 5.6 % Final   Comment:          Prediabetes: 5.7 - 6.4          Diabetes: >6.4          Glycemic control for adults with diabetes: <7.0    Est. average glucose Bld gHb Est-m* 07/11/2022 169  mg/dL Final    Allergies as of 07/18/2022   No Known Allergies      Medication List        Accurate as of July 18, 2022 12:07 PM. If you have any questions, ask your nurse or doctor.          STOP taking these medications    Dexcom G6 Sensor Misc Stopped by: Elayne Snare, MD   Dexcom G6 Transmitter Misc Stopped by: Elayne Snare, MD       TAKE these medications    Contour Next Test test strip Generic drug: glucose blood TEST BLOOD SUGAR 8 TIMES DAILY AS DIRECTED   ibuprofen 200 MG tablet Commonly known as: ADVIL Take 200 mg by mouth every 6 (six) hours as needed for pain (PRN).   INSULIN SYRINGE .5CC/31GX5/16" 31G X 5/16" 0.5 ML Misc 1 each by Does not apply route daily.   levonorgestrel 20 MCG/24HR IUD Commonly known as: MIRENA 1 each by Intrauterine route once.   Lyumjev 100 UNIT/ML Soln Generic drug: Insulin Lispro-aabc USE MAX OF 70 UNITS DAILY VIA INSULIN PUMP   MiniMed Mio  Advance  Infuse Set Misc Change every 5 days Started by: Elayne Snare, MD   Ozempic (0.25 or 0.5 MG/DOSE) 2 MG/1.5ML Sopn Generic drug: Semaglutide(0.25 or 0.5MG /DOS) INJECT 0.5 MG INTO THE SKIN ONCE A WEEK.   ramipril 5 MG capsule Commonly known as: ALTACE Take 5 mg by mouth daily.   rosuvastatin 20 MG tablet Commonly known as: CRESTOR TAKE 1 TABLET BY MOUTH EVERY DAY        Allergies: No Known Allergies  No past medical history on file.  No past surgical history on file.  Family History  Problem Relation Age of Onset   Hyperlipidemia Mother    Diabetes Neg Hx    Heart disease Neg Hx     Social History:  reports that she has never smoked. She has never used smokeless tobacco. No history on file for alcohol use and drug use.  REVIEW of systems:  She has had hypercholesterolemia, longstanding  With Crestor 20mg  her LDL previously was very well controlled but now above 100  She has taken Crestor regularly   Lab Results  Component Value Date   CHOL 200 07/11/2022   CHOL 177 10/11/2021   CHOL 191 02/27/2021   Lab Results  Component Value Date   HDL 74.10 07/11/2022   HDL 69.20 10/11/2021   HDL 79.90 02/27/2021   Lab Results  Component Value Date   LDLCALC 116 (H) 07/11/2022   LDLCALC 96 10/11/2021   LDLCALC 100 (H) 02/27/2021   Lab Results  Component Value Date   TRIG 53.0 07/11/2022   TRIG 58.0 10/11/2021   TRIG 56.0 02/27/2021   Lab Results  Component Value Date   CHOLHDL 3 07/11/2022   CHOLHDL 3 10/11/2021   CHOLHDL 2 02/27/2021   Lab Results  Component Value Date   LDLDIRECT 104.0 07/05/2019   LDLDIRECT 150.5 04/22/2013   Lab Results  Component Value Date   ALT 12 10/11/2021     HYPERTENSION:   Blood  pressure Rx with ramipril 10 mg daily  Blood pressure is higher at times in the office some whitecoat syndrome  Home blood pressure is about 130/84 when last checked  BP Readings from Last 3 Encounters:  07/18/22 (!) 142/88  11/15/21 (!)  142/98  10/15/21 (!) 142/90     Last urine microalbumin was checked in 3/23  EXAM:  BP (!) 142/88   Pulse (!) 113   Ht 5\' 3"  (1.6 m)   Wt 199 lb (90.3 kg)   SpO2 98%   BMI 35.25 kg/m     ASSESSMENT:  Diabetes 1 on insulin pump with inadequate control  See history of present illness for detailed discussion of  current management, blood sugar patterns and problems identified  A1c is 7.5  Currently using Medtronic 780 pump with Lyumjev insulin  Time in range variability is improving compared to previous visit However postprandial blood sugars are not consistently controlled but because of inadequate boluses at times with certain foods and relatively late boluses to prevent postprandial spikes Control is not necessarily worse with stopping Ozempic although has gained weight  She is planning to start regular exercise at the gym            RECOMMENDATIONS: She needs to bolus 5 to 10 minutes before starting to eat Also add at least 30% more boluses for eating out or any higher fat meals Continue to cover coffee intake in the morning with 2 to 3 units Enter blood sugar in the pump when bolusing  for carbohydrates   A1c will be checked always from [facility] because of falsely low readings from the in-house lab.    HYPERCHOLESTEROLEMIA :  Not adequately controlled and will increase Crestor to 40 mg daily Needs better diet likely   HYPERTENSION : Again blood pressure is higher and likely not at target even at home with diastolic over 80 For her target should be about 120/70 Will try Benicar HCT instead of ramipril, new prescription sent for 20/12.5 mg to start with She should check regularly at least once a week at home  Follow-up in 3 months   There are no Patient Instructions on file for this visit.    Reather Littler 07/18/2022, 12:07 PM

## 2022-07-18 NOTE — Patient Instructions (Addendum)
Change BP Rx  Check BP  Bolus 5-10 min before meals  Extra 30% more carbs for hi fat meals or eating out

## 2022-07-21 ENCOUNTER — Encounter: Payer: Self-pay | Admitting: Endocrinology

## 2022-07-21 MED ORDER — OLMESARTAN MEDOXOMIL-HCTZ 20-12.5 MG PO TABS: 1.0000 | ORAL_TABLET | Freq: Every day | ORAL | 2 refills | Status: DC

## 2022-07-21 MED ORDER — ROSUVASTATIN CALCIUM 40 MG PO TABS: 40.0000 mg | ORAL_TABLET | Freq: Every day | ORAL | 3 refills | Status: DC

## 2022-07-25 ENCOUNTER — Encounter: Payer: Self-pay | Admitting: Endocrinology

## 2022-08-10 ENCOUNTER — Other Ambulatory Visit: Payer: Self-pay | Admitting: Endocrinology

## 2022-09-27 ENCOUNTER — Other Ambulatory Visit: Payer: Self-pay | Admitting: Endocrinology

## 2022-09-27 DIAGNOSIS — E1065 Type 1 diabetes mellitus with hyperglycemia: Secondary | ICD-10-CM

## 2022-11-18 ENCOUNTER — Other Ambulatory Visit: Payer: Self-pay

## 2022-11-18 ENCOUNTER — Other Ambulatory Visit (INDEPENDENT_AMBULATORY_CARE_PROVIDER_SITE_OTHER): Payer: Managed Care, Other (non HMO)

## 2022-11-18 DIAGNOSIS — E7800 Pure hypercholesterolemia, unspecified: Secondary | ICD-10-CM

## 2022-11-18 DIAGNOSIS — E1065 Type 1 diabetes mellitus with hyperglycemia: Secondary | ICD-10-CM

## 2022-11-18 LAB — MICROALBUMIN / CREATININE URINE RATIO
Creatinine,U: 147.7 mg/dL
Microalb Creat Ratio: 0.8 mg/g (ref 0.0–30.0)
Microalb, Ur: 1.2 mg/dL (ref 0.0–1.9)

## 2022-11-19 LAB — COMPREHENSIVE METABOLIC PANEL
ALT: 16 IU/L (ref 0–32)
AST: 16 IU/L (ref 0–40)
Albumin/Globulin Ratio: 2.4 — ABNORMAL HIGH (ref 1.2–2.2)
Albumin: 4.3 g/dL (ref 3.8–4.9)
Alkaline Phosphatase: 69 IU/L (ref 44–121)
BUN/Creatinine Ratio: 15 (ref 9–23)
BUN: 14 mg/dL (ref 6–24)
Bilirubin Total: 0.2 mg/dL (ref 0.0–1.2)
CO2: 21 mmol/L (ref 20–29)
Calcium: 8.8 mg/dL (ref 8.7–10.2)
Chloride: 101 mmol/L (ref 96–106)
Creatinine, Ser: 0.91 mg/dL (ref 0.57–1.00)
Globulin, Total: 1.8 g/dL (ref 1.5–4.5)
Glucose: 175 mg/dL — ABNORMAL HIGH (ref 70–99)
Potassium: 4.3 mmol/L (ref 3.5–5.2)
Sodium: 139 mmol/L (ref 134–144)
Total Protein: 6.1 g/dL (ref 6.0–8.5)
eGFR: 76 mL/min/{1.73_m2} (ref >59)

## 2022-11-19 LAB — LIPID PANEL
Chol/HDL Ratio: 2.2 ratio (ref 0.0–4.4)
Cholesterol, Total: 183 mg/dL (ref 100–199)
HDL: 83 mg/dL (ref >39)
LDL Chol Calc (NIH): 87 mg/dL (ref 0–99)
Triglycerides: 68 mg/dL (ref 0–149)
VLDL Cholesterol Cal: 13 mg/dL (ref 5–40)

## 2022-11-19 LAB — HEMOGLOBIN A1C
Est. average glucose Bld gHb Est-mCnc: 166 mg/dL
Hgb A1c MFr Bld: 7.4 % — ABNORMAL HIGH (ref 4.8–5.6)

## 2022-11-22 ENCOUNTER — Ambulatory Visit: Payer: Managed Care, Other (non HMO) | Admitting: Endocrinology

## 2022-11-22 ENCOUNTER — Encounter: Payer: Self-pay | Admitting: Endocrinology

## 2022-11-22 VITALS — BP 132/90 | HR 102 | Wt 206.1 lb

## 2022-11-22 DIAGNOSIS — E1065 Type 1 diabetes mellitus with hyperglycemia: Secondary | ICD-10-CM | POA: Diagnosis not present

## 2022-11-22 DIAGNOSIS — E7800 Pure hypercholesterolemia, unspecified: Secondary | ICD-10-CM

## 2022-11-22 DIAGNOSIS — I10 Essential (primary) hypertension: Secondary | ICD-10-CM | POA: Diagnosis not present

## 2022-11-22 NOTE — Patient Instructions (Signed)
Monitor BP 

## 2022-11-22 NOTE — Progress Notes (Addendum)
Patient ID: Tammy Vega, female   DOB: 06/15/1971, 52 y.o.   MRN: [id number]   Reason for Appointment: Endocrinology followup:   History of Present Illness   Diagnosis: Type 1 DIABETES MELITUS, date of diagnosis:2003     DIABETES history: She was initially diagnosed with diabetes during pregnancy and required insulin. She went on insulin again in 2004 and was started on the pump in 2005.  Previously her control has been variable with needing periodic adjustments and A1c around 6-7 She also tried Symlin 45 g in 2008-9 with some benefit in her postprandial regimen but she stopped since she could not keep up with this  CURRENT insulin pump:  Medtronic 780   PUMP SETTINGS:  BASAL: Midnight = 0.575, 3 AM = 0.7, 5 AM = 1.7, 7:30 AM = 1. 45, 9:30 AM = 1.55, 1 PM = 1.5, 4 PM = 0.7 and 7 PM = 1.8, 11 PM = 0.65  Carbohydrate ratio 1: 8  for lunch and supper and 1:15 for breakfast  Sensitivity 1:50, at 6 PM 1:40 with target 90-100 Active insulin time 2 hours  Her A1c is falsely low done in the [facility] lab, is accurate done from [facility]  Recent blood sugar patterns from pump/Guardian CGM download and problems identified are as follows:  A1c is about the same at 7.4 Currently using Lyumjev insulin  He has used the 780 G insulin pumps since about 03/2022 She has not had much improvement in her blood sugar control especially postprandial Highs blood sugars as discussed in the CGM interpretation is after lunch She also has some snacks in the afternoon before dinner which may be causing higher readings at times However she appears to be under bolusing for meals at lunchtime either because of eating sweets, some eating out and not always low-fat meals Has gained weight again Currently not exercising Previously had some satiety improvement with Ozempic but she does not think this is covered by insurance now No hypoglycemia except rarely  Auto mode exits have  been occurring mostly because of high SG as well as low calibration and no ST values   Her CGM for the last 2 weeks with the guardian 4 sensor is interpreted as follows:  Overnight blood sugars are averaging about 180 at midnight and then progressively coming down to about 4-5 AM and then gradually rising, no hypoglycemia Premeal blood sugars are moderately increased both at lunch and dinner, averages below and higher at dinnertime Postprandial readings are only rising modestly after breakfast  Blood sugars after lunch are generally spiking on most days with some readings over 300 and coming down at variable rates Blood sugars are variable after dinner with frequent postprandial spikes over 200 but not consistent Hypoglycemia has been minimal with only 1 low blood sugar on April 20 is possibly from late correction of lunch hyperglycemia Time in range is only slightly better than the last visit Dawn phenomenon is present to a variable next   CGM use % of time Smart guard 94% 95%  2-week average/GV 157/34  Time in range       71%  % Time Above 180 22  % Time above 250 6  % Time Below 70 1     PRE-MEAL Fasting Lunch Dinner Bedtime Overall  Glucose range:       Averages: 163 154  183     POST-MEAL PC Breakfast PC Lunch PC Dinner  Glucose range:  Averages: 154 240 189   Previously  CGM use % of time   2-week average/sd 163/52, was 173/63  Time in range        69%  % Time Above 180 24  % Time above 250 6  % Time Below 70 1     PRE-MEAL Fasting Lunch Dinner Bedtime Overall  Glucose range:       Averages:  182 198     POST-MEAL PC Breakfast PC Lunch PC Dinner  Glucose range:     Averages: 142 193 213    Wt Readings from Last 3 Encounters:  11/22/22 206 lb 2 oz (93.5 kg)  07/18/22 199 lb (90.3 kg)  11/15/21 193 lb 12.8 oz (87.9 kg)    Lab Results  Component Value Date   HGBA1C 7.4 (H) 11/18/2022   HGBA1C 7.5 (H) 07/11/2022   HGBA1C 7.7 (H) 10/11/2021   Lab  Results  Component Value Date   MICROALBUR 1.2 11/18/2022   LDLCALC 87 11/18/2022   CREATININE 0.91 11/18/2022      Lab on 11/18/2022  Component Date Value Ref Range Status   Microalb, Ur 11/18/2022 1.2  0.0 - 1.9 mg/dL Final   Creatinine,U 44/07/270 147.7  mg/dL Final   Microalb Creat Ratio 11/18/2022 0.8  0.0 - 30.0 mg/g Final  Orders Only on 11/18/2022  Component Date Value Ref Range Status   Glucose 11/18/2022 175 (H)  70 - 99 mg/dL Final   BUN 53/66/4403 14  6 - 24 mg/dL Final   Creatinine, Ser 11/18/2022 0.91  0.57 - 1.00 mg/dL Final   eGFR 47/42/5956 76  >59 mL/min/1.73 Final   BUN/Creatinine Ratio 11/18/2022 15  9 - 23 Final   Sodium 11/18/2022 139  134 - 144 mmol/L Final   Potassium 11/18/2022 4.3  3.5 - 5.2 mmol/L Final   Chloride 11/18/2022 101  96 - 106 mmol/L Final   CO2 11/18/2022 21  20 - 29 mmol/L Final   Calcium 11/18/2022 8.8  8.7 - 10.2 mg/dL Final   Total Protein 38/75/6433 6.1  6.0 - 8.5 g/dL Final   Albumin 29/51/8841 4.3  3.8 - 4.9 g/dL Final   Globulin, Total 11/18/2022 1.8  1.5 - 4.5 g/dL Final   Albumin/Globulin Ratio 11/18/2022 2.4 (H)  1.2 - 2.2 Final   Bilirubin Total 11/18/2022 0.2  0.0 - 1.2 mg/dL Final   Alkaline Phosphatase 11/18/2022 69  44 - 121 IU/L Final   AST 11/18/2022 16  0 - 40 IU/L Final   ALT 11/18/2022 16  0 - 32 IU/L Final   Hgb A1c MFr Bld 11/18/2022 7.4 (H)  4.8 - 5.6 % Final   Comment:          Prediabetes: 5.7 - 6.4          Diabetes: >6.4          Glycemic control for adults with diabetes: <7.0    Est. average glucose Bld gHb Est-m* 11/18/2022 166  mg/dL Final   Cholesterol, Total 11/18/2022 183  100 - 199 mg/dL Final   Triglycerides 66/12/3014 68  0 - 149 mg/dL Final   HDL 08/06/3233 83  >39 mg/dL Final   VLDL Cholesterol Cal 11/18/2022 13  5 - 40 mg/dL Final   LDL Chol Calc (NIH) 11/18/2022 87  0 - 99 mg/dL Final   Chol/HDL Ratio 11/18/2022 2.2  0.0 - 4.4 ratio Final   Comment:  T.  Chol/HDL Ratio                                             Men  Women                               1/2 Avg.Risk  3.4    3.3                                   Avg.Risk  5.0    4.4                                2X Avg.Risk  9.6    7.1                                3X Avg.Risk 23.4   11.0     Allergies as of 11/22/2022   No Known Allergies      Medication List        Accurate as of November 22, 2022  9:45 AM. If you have any questions, ask your nurse or doctor.          Contour Next Test test strip Generic drug: glucose blood TEST BLOOD SUGAR 8 TIMES DAILY AS DIRECTED   ibuprofen 200 MG tablet Commonly known as: ADVIL Take 200 mg by mouth every 6 (six) hours as needed for pain (PRN).   INSULIN SYRINGE .5CC/31GX5/16" 31G X 5/16" 0.5 ML Misc 1 each by Does not apply route daily.   levonorgestrel 20 MCG/24HR IUD Commonly known as: MIRENA 1 each by Intrauterine route once.   Lyumjev 100 UNIT/ML Soln Generic drug: Insulin Lispro-aabc USE MAX OF 70 UNITS DAILY VIA INSULIN PUMP   MiniMed Mio Advance Infuse Set Misc Change every 5 days   olmesartan-hydrochlorothiazide 20-12.5 MG tablet Commonly known as: BENICAR HCT TAKE 1 TABLET BY MOUTH EVERY DAY   rosuvastatin 40 MG tablet Commonly known as: CRESTOR Take 1 tablet (40 mg total) by mouth daily.        Allergies: No Known Allergies  History reviewed. No pertinent past medical history.  History reviewed. No pertinent surgical history.  Family History  Problem Relation Age of Onset   Hyperlipidemia Mother    Diabetes Neg Hx    Heart disease Neg Hx     Social History:  reports that she has never smoked. She has never used smokeless tobacco. No history on file for alcohol use and drug use.  REVIEW of systems:  She has had hypercholesterolemia, longstanding  With Crestor 40 mg her LDL improved at 87 compared to 116   Lab Results  Component Value Date   CHOL 183 11/18/2022   CHOL 200 07/11/2022   CHOL  177 10/11/2021   Lab Results  Component Value Date   HDL 83 11/18/2022   HDL 74.10 07/11/2022   HDL 69.20 10/11/2021   Lab Results  Component Value Date   LDLCALC 87 11/18/2022   LDLCALC 116 (H) 07/11/2022   LDLCALC 96 10/11/2021   Lab Results  Component Value Date   TRIG 68 11/18/2022   TRIG 53.0 07/11/2022  TRIG 58.0 10/11/2021   Lab Results  Component Value Date   CHOLHDL 2.2 11/18/2022   CHOLHDL 3 07/11/2022   CHOLHDL 3 10/11/2021   Lab Results  Component Value Date   LDLDIRECT 104.0 07/05/2019   LDLDIRECT 150.5 04/22/2013   Lab Results  Component Value Date   ALT 16 11/18/2022     HYPERTENSION:   This has been treated with Benicar HCTZ, previously on enalapril  Blood pressure is generally high in the office some whitecoat syndrome  Has not monitored at home or at work  BP Readings from Last 3 Encounters:  11/22/22 (!) 132/90  07/18/22 (!) 142/88  11/15/21 (!) 142/98     Last urine microalbumin was checked in April 2024  EXAM:  BP (!) 132/90 (BP Location: Left Arm)   Pulse (!) 102   Wt 206 lb 2 oz (93.5 kg)   SpO2 98%   BMI 36.51 kg/m   Diabetic Foot Exam - Simple   Simple Foot Form Diabetic Foot exam was performed with the following findings: Yes   Visual Inspection No deformities, no ulcerations, no other skin breakdown bilaterally: Yes Sensation Testing Intact to touch and monofilament testing bilaterally: Yes Pulse Check Posterior Tibialis and Dorsalis pulse intact bilaterally: Yes Comments    No pedal edema  ASSESSMENT:   Diabetes 1 on insulin pump with inadequate control  See history of present illness for detailed discussion of  current management, blood sugar patterns and problems identified  A1c is 7.4 and stable  She is using Medtronic 780 pump with Lyumjev insulin  Time in range is just over 70% but she still has significant postprandial spikes especially at lunch This is as discussed above related to generally  larger meals at lunch with sweets or higher fat intake She is trying to get her bolus done on time and since presently better Weight gain is again a problem Has not exercised as she was planning to do on the last visit          RECOMMENDATIONS: She needs to bolus 5 to 10 minutes before starting to eat consistently She will need to cut back on: Carbohydrates, high-fat meals, snacks especially at lunchtime Need to start exercising regularly Consultation with nutritionist for more specific meal planning Also add at least 30% more boluses for eating out or any higher fat meals Continue to cover dawn phenomenon or morning coffee in the morning with 2 to 3 units bolus Enter blood sugar in the pump when bolusing for carbohydrates   A1c will be checked always from [facility] because of falsely low readings from the in-house lab.    HYPERCHOLESTEROLEMIA :  This is now adequately controlled with increase in Crestor to 40 mg daily Needs better diet also   HYPERTENSION : Again blood pressure is high in the office and discussed that she needs to check at home regularly  Follow-up in 3 months and keep regular appointments  Also encouraged her to establish with a PCP  Patient Instructions  Monitor BP    Reather Littler 11/22/2022, 9:45 AM

## 2022-11-29 ENCOUNTER — Encounter: Payer: Self-pay | Admitting: Endocrinology

## 2022-12-19 ENCOUNTER — Telehealth: Payer: Self-pay

## 2022-12-19 NOTE — Telephone Encounter (Signed)
Pharmacy Patient Advocate Encounter  Received notification that Prior Authorization for Guardian 4 Glucose Sensors has been approved   PA # [id number] Effective dates: 12/11/22 through 12/11/23

## 2023-01-15 ENCOUNTER — Other Ambulatory Visit: Payer: Self-pay | Admitting: Endocrinology

## 2023-01-28 ENCOUNTER — Other Ambulatory Visit: Payer: Self-pay | Admitting: Endocrinology

## 2023-03-03 ENCOUNTER — Other Ambulatory Visit (INDEPENDENT_AMBULATORY_CARE_PROVIDER_SITE_OTHER): Payer: Managed Care, Other (non HMO)

## 2023-03-03 DIAGNOSIS — E1065 Type 1 diabetes mellitus with hyperglycemia: Secondary | ICD-10-CM | POA: Diagnosis not present

## 2023-03-03 LAB — BASIC METABOLIC PANEL
BUN: 13 mg/dL (ref 6–23)
CO2: 29 mEq/L (ref 19–32)
Calcium: 9 mg/dL (ref 8.4–10.5)
Chloride: 102 mEq/L (ref 96–112)
Creatinine, Ser: 0.98 mg/dL (ref 0.40–1.20)
GFR: 66.61 mL/min (ref >60.00)
Glucose, Bld: 156 mg/dL — ABNORMAL HIGH (ref 70–99)
Potassium: 4.3 mEq/L (ref 3.5–5.1)
Sodium: 139 mEq/L (ref 135–145)

## 2023-03-03 LAB — HEMOGLOBIN A1C: Hgb A1c MFr Bld: 4.9 % (ref 4.6–6.5)

## 2023-03-06 ENCOUNTER — Encounter: Payer: Self-pay | Admitting: Endocrinology

## 2023-03-06 ENCOUNTER — Ambulatory Visit: Payer: Managed Care, Other (non HMO) | Admitting: Endocrinology

## 2023-03-06 VITALS — BP 130/90 | HR 119 | Ht 63.0 in | Wt 214.6 lb

## 2023-03-06 DIAGNOSIS — E1065 Type 1 diabetes mellitus with hyperglycemia: Secondary | ICD-10-CM

## 2023-03-06 DIAGNOSIS — I10 Essential (primary) hypertension: Secondary | ICD-10-CM

## 2023-03-06 DIAGNOSIS — E7800 Pure hypercholesterolemia, unspecified: Secondary | ICD-10-CM

## 2023-03-06 NOTE — Progress Notes (Signed)
Patient ID: [name], female   DOB: 02-02-1971, 52 y.o.   MRN: [id number]   Reason for Appointment: Endocrinology followup:   History of Present Illness   Diagnosis: Type 1 DIABETES MELITUS, date of diagnosis:2003     DIABETES history: She was initially diagnosed with diabetes during pregnancy and required insulin. She went on insulin again in 2004 and was started on the pump in 2005.  Previously her control has been variable with needing periodic adjustments and A1c around 6-7 She also tried Symlin 45 g in 2008-9 with some benefit in her postprandial regimen but she stopped since she could not keep up with this  CURRENT insulin pump:  Medtronic 780   PUMP SETTINGS:  BASAL: Midnight = 0.575, 3 AM = 0.7, 5 AM = 1.7, 7:30 AM = 1. 45, 9:30 AM = 1.55, 1 PM = 1.5, 4 PM = 0.7 and 7 PM = 1.8, 11 PM = 0.65  Carbohydrate ratio 1: 8  for lunch and supper and 1:15 for breakfast  Sensitivity 1:50, at 6 PM 1:40 with target 90-100 Active insulin time 2 hours  Her A1c is falsely low done in the [facility] lab, is accurate done from [facility]  Recent blood sugar patterns from pump/Guardian CGM download and problems identified are as follows:  A1c is falsely low as it was not sent to [facility]  Currently using Lyumjev insulin  He has used the 780 G insulin pumps since 03/2022 She has not had any improvement in her overall control and timing ranges similar to the last visit Has significant postprandial hyperglycemia frequently as discussed in the CGM analysis This is related to either late boluses after starting to eat or inadequate boluses both at lunch or dinner She has some significant spikes in her blood sugars after meals at both lunch and dinner but not consistent Generally may be higher after lunch compared to dinner Occasionally it does appear to have missed boluses for meals However blood sugars only sometimes come down back to baseline levels after  hyperglycemic spikes No hypoglycemia overnight blood sugars are generally fairly good Because of the hyperglycemia she is getting about 43% of her boluses as auto correction boluses  She says that sometimes her infusion site in the thighs do not work very well and she is not clear where to insert the infusion set while avoiding the abdomen Smart guard has been active 91% of the time Has gained weight again Again not exercising Previously had some satiety improvement with Ozempic but she does not think this is covered by insurance now No hypoglycemia    Her CGM for the last 2 weeks with the guardian 4 sensor is interpreted as follows:  Overnight blood sugars are relatively higher than average around bedtime and gradually decreasing after midnight with baseline average 180 and 100 readings by about 6 AM No hypoglycemia overnight Premeal blood sugars are variable but occasionally in the upper 100s or near 200s range  Postprandial readings are generally significantly high and she has significant hyperglycemic spikes after some meals at both lunch and dinner especially on 8/5 and 8/6 after dinner except for rare low normal readings she does not have any hypoglycemia   CGM use % of time 89  2-week average/GV 159/32  Time in range        % 72  % Time Above 180 23  % Time above 250 5  % Time Below 70  PRE-MEAL Fasting Lunch Dinner Bedtime Overall  Glucose range:       Averages: 174 204 224     POST-MEAL PC Breakfast PC Lunch PC Dinner  Glucose range:     Averages: 137 215 230   Previously:  CGM use % of time Smart guard 94% 95%  2-week average/GV 157/34  Time in range       71%  % Time Above 180 22  % Time above 250 6  % Time Below 70 1     PRE-MEAL Fasting Lunch Dinner Bedtime Overall  Glucose range:       Averages: 163 154  183     POST-MEAL PC Breakfast PC Lunch PC Dinner  Glucose range:     Averages: 154 240 189      Wt Readings from Last 3 Encounters:   03/06/23 214 lb 9.6 oz (97.3 kg)  11/22/22 206 lb 2 oz (93.5 kg)  07/18/22 199 lb (90.3 kg)    Lab Results  Component Value Date   HGBA1C 4.9 03/03/2023   HGBA1C 7.4 (H) 11/18/2022   HGBA1C 7.5 (H) 07/11/2022   Lab Results  Component Value Date   MICROALBUR 1.2 11/18/2022   LDLCALC 87 11/18/2022   CREATININE 0.98 03/03/2023      Lab on 03/03/2023  Component Date Value Ref Range Status   Sodium 03/03/2023 139  135 - 145 mEq/L Final   Potassium 03/03/2023 4.3  3.5 - 5.1 mEq/L Final   Chloride 03/03/2023 102  96 - 112 mEq/L Final   CO2 03/03/2023 29  19 - 32 mEq/L Final   Glucose, Bld 03/03/2023 156 (H)  70 - 99 mg/dL Final   BUN 21/30/8657 13  6 - 23 mg/dL Final   Creatinine, Ser 03/03/2023 0.98  0.40 - 1.20 mg/dL Final   GFR 84/69/6295 66.61  >60.00 mL/min Final   Calculated using the CKD-EPI Creatinine Equation (2021)   Calcium 03/03/2023 9.0  8.4 - 10.5 mg/dL Final   Hgb M8U MFr Bld 03/03/2023 4.9  4.6 - 6.5 % Final   Glycemic Control Guidelines for People with Diabetes:Non Diabetic:  <6%Goal of Therapy: <7%Additional Action Suggested:  >8%     Allergies as of 03/06/2023   No Known Allergies      Medication List        Accurate as of March 06, 2023 11:34 AM. If you have any questions, ask your nurse or doctor.          Contour Next Test test strip Generic drug: glucose blood TEST BLOOD SUGAR 8 TIMES DAILY AS DIRECTED   ibuprofen 200 MG tablet Commonly known as: ADVIL Take 200 mg by mouth every 6 (six) hours as needed for pain (PRN).   INSULIN SYRINGE .5CC/31GX5/16" 31G X 5/16" 0.5 ML Misc 1 each by Does not apply route daily.   levonorgestrel 20 MCG/24HR IUD Commonly known as: MIRENA 1 each by Intrauterine route once.   Lyumjev 100 UNIT/ML Soln Generic drug: Insulin Lispro-aabc USE MAX OF 70 UNITS DAILY VIA INSULIN PUMP   MiniMed Mio Advance Infuse Set Misc Change every 5 days   olmesartan-hydrochlorothiazide 20-12.5 MG tablet Commonly known  as: BENICAR HCT TAKE 1 TABLET BY MOUTH EVERY DAY   rosuvastatin 40 MG tablet Commonly known as: CRESTOR Take 1 tablet (40 mg total) by mouth daily.        Allergies: No Known Allergies  History reviewed. No pertinent past medical history.  History reviewed. No pertinent surgical history.  Family  History  Problem Relation Age of Onset   Hyperlipidemia Mother    Diabetes Neg Hx    Heart disease Neg Hx     Social History:  reports that she has never smoked. She has never used smokeless tobacco. She reports current alcohol use. She reports that she does not currently use drugs.  REVIEW of systems:  She has had hypercholesterolemia, longstanding  With Crestor 40 mg her LDL improved at 87 compared to 116   Lab Results  Component Value Date   CHOL 183 11/18/2022   CHOL 200 07/11/2022   CHOL 177 10/11/2021   Lab Results  Component Value Date   HDL 83 11/18/2022   HDL 74.10 07/11/2022   HDL 69.20 10/11/2021   Lab Results  Component Value Date   LDLCALC 87 11/18/2022   LDLCALC 116 (H) 07/11/2022   LDLCALC 96 10/11/2021   Lab Results  Component Value Date   TRIG 68 11/18/2022   TRIG 53.0 07/11/2022   TRIG 58.0 10/11/2021   Lab Results  Component Value Date   CHOLHDL 2.2 11/18/2022   CHOLHDL 3 07/11/2022   CHOLHDL 3 10/11/2021   Lab Results  Component Value Date   LDLDIRECT 104.0 07/05/2019   LDLDIRECT 150.5 04/22/2013   Lab Results  Component Value Date   ALT 16 11/18/2022     HYPERTENSION:   This has been treated with Benicar HCTZ, previously on enalapril  Blood pressure is generally high in the office likely from whitecoat syndrome  Has not monitored at home or at work Still has not established with Tammy PCP   BP Readings from Last 3 Encounters:  03/06/23 (!) 130/90  11/22/22 (!) 132/90  07/18/22 (!) 142/88     Last urine microalbumin was checked in April 2024  EXAM:  BP (!) 130/90   Pulse (!) 119   Ht 5\' 3"  (1.6 m)   Wt 214 lb 9.6  oz (97.3 kg)   SpO2 98%   BMI 38.01 kg/m     No pedal edema  ASSESSMENT:   Diabetes 1 on insulin pump with inadequate control  See history of present illness for detailed discussion of  current management, blood sugar patterns and problems identified  A1c is 7.4 and stable  She is using Medtronic 780 pump with Lyumjev insulin  Blood sugar control is still suboptimal with postprandial hyperglycemia This is partly related to dietary factors with relatively high fat meals and some insulin resistance She has significant rising blood sugars sometimes after lunch or dinner and part of this is related to late boluses and occasionally missed boluses This is despite using Lyumjev insulin Also can benefit from exercise which she has not done          RECOMMENDATIONS: She needs to bolus 5 to 10 minutes before starting to eat consistently Try to improve her diet with lower fat meals Make sure she is bolusing for all meals Start regular walking program or other exercise Need to make sure she is looking up carbohydrate content accurately when she is eating If she is having infusion site issues she can try to switch to upper arm also; also can sometimes use her arm for the sensor   A1c will be checked always from [facility] because of falsely low readings from the in-house lab. This will be repeated today   HYPERTENSION : As before blood pressure is high in the office and unclear whether she needs additional medication She does need to see Tammy PCP  She will also make sure she tries to check her blood pressure at home regularly and let us know if it is consistently high  Recheck lipids on the next visit  Patient Instructions  Make sure you bolus about 10 minutes before each meal Avoid high-fat meals For any relatively higher fat meals take extra 30 to 40% more bolus  Start regular walking or other exercise program Change carbohydrate ratio at lunch to 1: 7  Try using the upper arm  for your infusion sites  Start checking blood pressure regularly at home  Please establish with Tammy primary care physician    Reather Littler 03/06/2023, 11:34 AM

## 2023-03-06 NOTE — Patient Instructions (Addendum)
Make sure you bolus about 10 minutes before each meal Avoid high-fat meals For any relatively higher fat meals take extra 30 to 40% more bolus  Start regular walking or other exercise program Change carbohydrate ratio at lunch to 1: 7  Try using the upper arm for your infusion sites  Start checking blood pressure regularly at home  Please establish with a primary care physician

## 2023-03-11 ENCOUNTER — Encounter: Payer: Self-pay | Admitting: Endocrinology

## 2023-04-17 ENCOUNTER — Other Ambulatory Visit: Payer: Self-pay

## 2023-04-17 ENCOUNTER — Telehealth: Payer: Self-pay | Admitting: Endocrinology

## 2023-04-17 DIAGNOSIS — I10 Essential (primary) hypertension: Secondary | ICD-10-CM

## 2023-04-17 MED ORDER — OLMESARTAN MEDOXOMIL-HCTZ 20-12.5 MG PO TABS
1.0000 | ORAL_TABLET | Freq: Every day | ORAL | 0 refills | Status: DC
Start: 2023-04-17 — End: 2023-07-16

## 2023-04-17 NOTE — Telephone Encounter (Signed)
MEDICATION: olmesartan-hydrochlorothiazide olmesartan-hydrochlorothiazide (BENICAR HCT) 20-12.5 MG tablet  PHARMACY:    [id number] - [place], [place] - [address] (Ph: [phone])    HAS THE PATIENT CONTACTED THEIR PHARMACY?  Yes  IS THIS A 90 DAY SUPPLY : Yes  IS PATIENT OUT OF MEDICATION: No  IF NOT; HOW MUCH IS LEFT: 1 week  LAST APPOINTMENT DATE: @8 /02/2023  NEXT APPOINTMENT DATE:@11 /26/2024  DO WE HAVE YOUR PERMISSION TO LEAVE A DETAILED MESSAGE?: Yes  OTHER COMMENTS:    **Let patient know to contact pharmacy at the end of the day to make sure medication is ready. **  ** Please notify patient to allow 48-72 hours to process**  **Encourage patient to contact the pharmacy for refills or they can request refills through [facility]**

## 2023-04-22 ENCOUNTER — Other Ambulatory Visit: Payer: Self-pay

## 2023-04-22 ENCOUNTER — Telehealth: Payer: Self-pay | Admitting: Endocrinology

## 2023-04-22 DIAGNOSIS — E7800 Pure hypercholesterolemia, unspecified: Secondary | ICD-10-CM

## 2023-04-22 MED ORDER — ROSUVASTATIN CALCIUM 40 MG PO TABS: 40.0000 mg | ORAL_TABLET | Freq: Every day | ORAL | 3 refills | Status: DC

## 2023-04-22 NOTE — Telephone Encounter (Signed)
ERROR

## 2023-04-22 NOTE — Telephone Encounter (Signed)
MEDICATION: rosuvastatin rosuvastatin (CRESTOR) 40 MG tablet  PHARMACY:    [id number] - [place], [place] - [address] (Ph: [phone])    HAS THE PATIENT CONTACTED THEIR PHARMACY?  Yes  IS THIS A 90 DAY SUPPLY : Yes  IS PATIENT OUT OF MEDICATION: No  IF NOT; HOW MUCH IS LEFT: 2 Days  LAST APPOINTMENT DATE: @8 /02/2023  NEXT APPOINTMENT DATE:@11 /26/2024  DO WE HAVE YOUR PERMISSION TO LEAVE A DETAILED MESSAGE?: Yes  OTHER COMMENTS: Wrong medication was put in first message from 04/17/2023 by Hollace Hayward   **Let patient know to contact pharmacy at the end of the day to make sure medication is ready. **  ** Please notify patient to allow 48-72 hours to process**  **Encourage patient to contact the pharmacy for refills or they can request refills through [facility]**

## 2023-04-22 NOTE — Telephone Encounter (Signed)
MEDICATION: rosuvastatin rosuvastatin (CRESTOR) 40 MG tablet   PHARMACY:    [id number] - [place], [place] - [address] (Ph: [phone])      [facility]

## 2023-06-13 ENCOUNTER — Other Ambulatory Visit: Payer: Self-pay

## 2023-06-13 DIAGNOSIS — E1065 Type 1 diabetes mellitus with hyperglycemia: Secondary | ICD-10-CM

## 2023-06-13 DIAGNOSIS — E7800 Pure hypercholesterolemia, unspecified: Secondary | ICD-10-CM

## 2023-06-18 ENCOUNTER — Other Ambulatory Visit (INDEPENDENT_AMBULATORY_CARE_PROVIDER_SITE_OTHER): Payer: Managed Care, Other (non HMO)

## 2023-06-18 DIAGNOSIS — E7800 Pure hypercholesterolemia, unspecified: Secondary | ICD-10-CM | POA: Diagnosis not present

## 2023-06-18 DIAGNOSIS — E1065 Type 1 diabetes mellitus with hyperglycemia: Secondary | ICD-10-CM

## 2023-06-18 LAB — COMPREHENSIVE METABOLIC PANEL
ALT: 15 U/L (ref 0–35)
AST: 16 U/L (ref 0–37)
Albumin: 3.9 g/dL (ref 3.5–5.2)
Alkaline Phosphatase: 48 U/L (ref 39–117)
BUN: 19 mg/dL (ref 6–23)
CO2: 28 meq/L (ref 19–32)
Calcium: 8.8 mg/dL (ref 8.4–10.5)
Chloride: 104 meq/L (ref 96–112)
Creatinine, Ser: 0.87 mg/dL (ref 0.40–1.20)
GFR: 76.68 mL/min (ref >60.00)
Glucose, Bld: 133 mg/dL — ABNORMAL HIGH (ref 70–99)
Potassium: 4 meq/L (ref 3.5–5.1)
Sodium: 137 meq/L (ref 135–145)
Total Bilirubin: 0.3 mg/dL (ref 0.2–1.2)
Total Protein: 6.2 g/dL (ref 6.0–8.3)

## 2023-06-18 LAB — LIPID PANEL
Cholesterol: 173 mg/dL (ref 0–200)
HDL: 57.8 mg/dL (ref >39.00)
LDL Cholesterol: 104 mg/dL — ABNORMAL HIGH (ref 0–99)
NonHDL: 115.68
Total CHOL/HDL Ratio: 3
Triglycerides: 58 mg/dL (ref 0.0–149.0)
VLDL: 11.6 mg/dL (ref 0.0–40.0)

## 2023-06-18 NOTE — Addendum Note (Signed)
Addended by: Bernerd Pho I on: 06/18/2023 07:18 AM   Modules accepted: Orders

## 2023-06-19 LAB — HEMOGLOBIN A1C
Est. average glucose Bld gHb Est-mCnc: 169 mg/dL
Hgb A1c MFr Bld: 7.5 % — ABNORMAL HIGH (ref 4.8–5.6)

## 2023-06-24 ENCOUNTER — Ambulatory Visit: Payer: Managed Care, Other (non HMO) | Admitting: Endocrinology

## 2023-06-24 ENCOUNTER — Encounter: Payer: Self-pay | Admitting: Endocrinology

## 2023-06-24 VITALS — BP 142/94 | HR 125 | Resp 20 | Ht 63.0 in | Wt 216.2 lb

## 2023-06-24 DIAGNOSIS — E1065 Type 1 diabetes mellitus with hyperglycemia: Secondary | ICD-10-CM | POA: Diagnosis not present

## 2023-06-24 MED ORDER — METFORMIN HCL ER 500 MG PO TB24: ORAL_TABLET | ORAL | 3 refills | Status: DC

## 2023-06-24 NOTE — Progress Notes (Signed)
Outpatient Endocrinology Note [place] Runette Scifres, MD  06/24/23  Patient's Name: Tammy Vega    DOB: 08-19-1970    MRN: [id number]                                                    REASON OF VISIT: Follow up for type 1 diabetes mellitus  PCP: Patient, No Pcp Per  HISTORY OF PRESENT ILLNESS:   Tammy Vega is a 52 y.o. old female with past medical history listed below, is here for follow up for type 1 diabetes mellitus.  Patient was last seen by Dr. Lucianne Muss in August 2024.  Pertinent Diabetes History: Patient was diagnosed with type 1 diabetes mellitus in 2003.  She was initially diagnosed during pregnancy and required insulin.  She went on insulin again in 2004 and was started on insulin pump in 2005. She also tried Symlin 45 g /pramlintide in 2008-9 with some benefit in her postprandial regimen but she stopped since she could not keep up with this.  No type 1 diabetes autoimmune panel available to review.  Currently on Medtronic 780 insulin pump.  Chronic Diabetes Complications : Retinopathy: no. Last ophthalmology exam was done on annually, following with ophthalmology regularly.  Nephropathy: no, on ACE/ARB /olmesartan. Peripheral neuropathy: on Coronary artery disease: no Stroke: no  Relevant comorbidities and cardiovascular risk factors: Obesity: yes Body mass index is 38.3 kg/m.  Hypertension: Yes  Hyperlipidemia : Yes, on statin.   Current / Home Diabetic regimen includes:  Medtronic 780G with Guardian sensor using Lyumjev U100.  Insulin Pump setting:  Basal MN- 0.600u/hour 3AM- 1.55  5AM- 1.10 7:30AM-1.55 9:30AM-1.65 1PM-      0.725 4PM-      1.85 7PM-      0.650 11PM-    0.625  Bolus CHO Ratio (1unit:CHO) MN- 1:12 10AM- 1:6 3PM- 1:8 10PM- 1:10  Correction/Sensitivity: MN- 1:40 6AM- 1:35  Target: 90-140  Active insulin time: 3 hours  Prior diabetic medications: Symlin in 2008/2009.  Previously had some satiety improvement with Ozempic  but she does not think this is covered by insurance now.  CONTINUOUS GLUCOSE MONITORING SYSTEM (CGMS) / INSULIN PUMP INTERPRETATION:                         Medtronic Pump 7 80G & Sensor Download (Reviewed and summarized below.)  Average total daily insulin:   60 units, Bolus: 60%, auto correction 37% basal: 40%  Trends:  Patient has frequent hyperglycemia postprandially with blood sugar up to 250s range with different meals some time with breakfast, some time with supper and some time with lunch related with mostly late bolus and inadequate meal bolus and some time missed meal bolus.  Blood sugar in between the meals and overnight are acceptable.  No hypoglycemia.  Hypoglycemia: Patient has no hypoglycemic episodes. Patient has hypoglycemia awareness.    Factors modifying glucose control: 1.  Diabetic diet assessment: 3 meals a day.  2.  Staying active or exercising: Walking.  3.  Medication compliance: compliant most of the time.  Interval history  Pump and CGM data as reviewed above.  She has frequent hyperglycemia related with meals.  Hemoglobin A1c 7.5%.  Recent lab results reviewed.  Cholesterol level LDL improving.  Electrolytes and renal function is stable.  She is concerned about  continued weight gain.  And not able to lose weight.  She had tried Ozempic in the past however her medical insulin does not cover it.  She recalls taking metformin in the beginning of diagnosis time however was stopped and started on insulin therapy right after diagnosis.  Does not recall any side effect related to the metformin.  REVIEW OF SYSTEMS As per history of present illness.   PAST MEDICAL HISTORY: History reviewed. No pertinent past medical history.  PAST SURGICAL HISTORY: History reviewed. No pertinent surgical history.  ALLERGIES: No Known Allergies  FAMILY HISTORY:  Family History  Problem Relation Age of Onset   Hyperlipidemia Mother    Diabetes Neg Hx    Heart disease Neg Hx      SOCIAL HISTORY: Social History   Socioeconomic History   Marital status: Married    Spouse name: Not on file   Number of children: Not on file   Years of education: Not on file   Highest education level: Not on file  Occupational History   Not on file  Tobacco Use   Smoking status: Never   Smokeless tobacco: Never  Substance and Sexual Activity   Alcohol use: Yes    Comment: occ   Drug use: Not Currently   Sexual activity: Not on file  Other Topics Concern   Not on file  Social History Narrative   Not on file   Social Determinants of Health   Financial Resource Strain: Not on file  Food Insecurity: Not on file  Transportation Needs: Not on file  Physical Activity: Not on file  Stress: Not on file  Social Connections: Not on file    MEDICATIONS:  Current Outpatient Medications  Medication Sig Dispense Refill   CONTOUR NEXT TEST test strip TEST BLOOD SUGAR 8 TIMES DAILY AS DIRECTED 300 each 3   ibuprofen (ADVIL,MOTRIN) 200 MG tablet Take 200 mg by mouth every 6 (six) hours as needed for pain (PRN).     Insulin Infusion Pump Supplies (MINIMED MIO ADVANCE INFUSE SET) MISC Change every 5 days 2 each 3   Insulin Syringe-Needle U-100 (INSULIN SYRINGE .5CC/31GX5/16") 31G X 5/16" 0.5 ML MISC 1 each by Does not apply route daily. 10 each 0   levonorgestrel (MIRENA) 20 MCG/24HR IUD 1 each by Intrauterine route once.     LYUMJEV 100 UNIT/ML SOLN USE MAX OF 70 UNITS DAILY VIA INSULIN PUMP 30 mL 3   metFORMIN (GLUCOPHAGE-XR) 500 MG 24 hr tablet Start metformin XR 500 mg 1 tab daily for 1 week, then increase to 2 tab daily for 1 week, then increase to 3 tabs daily for 1 week and then increase to 4 tabs daily. 360 tablet 3   olmesartan-hydrochlorothiazide (BENICAR HCT) 20-12.5 MG tablet Take 1 tablet by mouth daily. 90 tablet 0   rosuvastatin (CRESTOR) 40 MG tablet Take 1 tablet (40 mg total) by mouth daily. 90 tablet 3   No current facility-administered medications for this  visit.    PHYSICAL EXAM: Vitals:   06/24/23 1056  BP: (!) 142/94  Pulse: (!) 125  Resp: 20  SpO2: 98%  Weight: 216 lb 3.2 oz (98.1 kg)  Height: 5\' 3"  (1.6 m)   Body mass index is 38.3 kg/m.  Wt Readings from Last 3 Encounters:  06/24/23 216 lb 3.2 oz (98.1 kg)  03/06/23 214 lb 9.6 oz (97.3 kg)  11/22/22 206 lb 2 oz (93.5 kg)    General: Well developed, well nourished female in no apparent distress.  HEENT: AT/[place], no external lesions.  Eyes: Conjunctiva clear and no icterus. Neck: Neck supple  Lungs: Respirations not labored Neurologic: Alert, oriented, normal speech Extremities / Skin: Dry.   Psychiatric: Does not appear depressed or anxious  Diabetic Foot Exam - Simple   No data filed    LABS Reviewed Lab Results  Component Value Date   HGBA1C 7.5 (H) 06/18/2023   HGBA1C 7.1 (H) 03/06/2023   HGBA1C 4.9 03/03/2023   Lab Results  Component Value Date   FRUCTOSAMINE 334 (H) 02/02/2014   FRUCTOSAMINE 305 (H) 10/28/2013   FRUCTOSAMINE 315 (H) 04/23/2013   Lab Results  Component Value Date   CHOL 173 06/18/2023   HDL 57.80 06/18/2023   LDLCALC 104 (H) 06/18/2023   LDLDIRECT 104.0 07/05/2019   TRIG 58.0 06/18/2023   CHOLHDL 3 06/18/2023   Lab Results  Component Value Date   MICRALBCREAT 0.8 11/18/2022   MICRALBCREAT 0.9 10/11/2021   Lab Results  Component Value Date   CREATININE 0.87 06/18/2023   Lab Results  Component Value Date   GFR 76.68 06/18/2023    ASSESSMENT / PLAN  1. Uncontrolled type 1 diabetes mellitus with hyperglycemia (HCC)     Diabetes Mellitus type 1, complicated by no known complications. - Diabetic status / severity: Uncontrolled, worsening.  Lab Results  Component Value Date   HGBA1C 7.5 (H) 06/18/2023    - Hemoglobin A1c goal <7%   Discussed about controlling carbohydrate portion control and diabetes diet.  Discussed about bolusing meal time insulin at least 5 minutes before eating.  Adjusted insulin pump setting as  follows.  I would like to start metformin to have improvement on insulin sensitivity, expected to require less amount of insulin and also may help to lose weight.  - Medications:  Insulin pump setting changed as follows:  Medtronic 780G with Guardian sensor using Lyumjev U100.  Insulin Pump setting:  Basal MN- 0.600u/hour 3AM- 1.55  5AM- 1.10 7:30AM-1.55 9:30AM-1.65 1PM-      0.725 4PM-      1.85 7PM-      0.650 11PM-    0.625  Bolus CHO Ratio (1unit:CHO) MN- 1:12, changed to 1 : 10 10AM- 1:6 3PM- 1:8 10PM- 1:10  Correction/Sensitivity: MN- 1:40 6AM- 1:35. Changed to 1:30  Target: 90-140  Active insulin time: 3 hours  Start metformin 500 mg extended release 1 tablet daily for 1 week and increase by 1 tablet every week until taking 4 tablets daily.  - Home glucose testing: continue CGM and check blood glucose as needed.  - Discussed/ Gave Hypoglycemia treatment plan.  # Consult : not required at this time.   # Annual urine for microalbuminuria/ creatinine ratio, no microalbuminuria currently, continue ACE/ARB /olmesartan. Last  Lab Results  Component Value Date   MICRALBCREAT 0.8 11/18/2022    # Foot check nightly.  # Annual dilated diabetic eye exams.   - Diet: Make healthy diabetic food choices - Life style / activity / exercise: Discussed.  2. Blood pressure  -  BP Readings from Last 1 Encounters:  06/24/23 (!) 142/94    - Control is not in target.  - No change in current plans.  Monitor at home and if still high discuss with primary care provider.  3. Lipid status / Hyperlipidemia - Last  Lab Results  Component Value Date   LDLCALC 104 (H) 06/18/2023   - Continue rosuvastatin 40 mg daily.  LDL acceptable.  Diagnoses and all orders for this visit:  Uncontrolled type  1 diabetes mellitus with hyperglycemia (HCC) -     Hemoglobin A1c -     Basic metabolic panel  Other orders -     metFORMIN (GLUCOPHAGE-XR) 500 MG 24 hr tablet; Start  metformin XR 500 mg 1 tab daily for 1 week, then increase to 2 tab daily for 1 week, then increase to 3 tabs daily for 1 week and then increase to 4 tabs daily.    DISPOSITION Follow up in clinic in 3 months suggested.   All questions answered and patient verbalized understanding of the plan.  [place] Jovonda Selner, MD [facility] Endocrinology [facility] [address] [place], [address] [place], [place] [address] Phone # [phone]  At least part of this note was generated using voice recognition software. Inadvertent word errors may have occurred, which were not recognized during the proofreading process.

## 2023-06-24 NOTE — Patient Instructions (Signed)
Try metformin 1 tab daily and increase 1 tab every week until 4 tabs daily.

## 2023-07-16 ENCOUNTER — Other Ambulatory Visit: Payer: Self-pay | Admitting: Endocrinology

## 2023-07-16 DIAGNOSIS — I10 Essential (primary) hypertension: Secondary | ICD-10-CM

## 2023-08-25 ENCOUNTER — Other Ambulatory Visit: Payer: Self-pay

## 2023-08-25 DIAGNOSIS — E1065 Type 1 diabetes mellitus with hyperglycemia: Secondary | ICD-10-CM

## 2023-08-25 MED ORDER — LYUMJEV 100 UNIT/ML IJ SOLN: INTRAMUSCULAR | 3 refills | Status: DC

## 2023-09-26 ENCOUNTER — Other Ambulatory Visit: Payer: Managed Care, Other (non HMO)

## 2023-09-29 ENCOUNTER — Ambulatory Visit: Payer: Managed Care, Other (non HMO) | Admitting: Endocrinology

## 2023-10-14 ENCOUNTER — Other Ambulatory Visit

## 2023-10-15 ENCOUNTER — Other Ambulatory Visit: Payer: Self-pay | Admitting: Endocrinology

## 2023-10-15 ENCOUNTER — Other Ambulatory Visit

## 2023-10-15 ENCOUNTER — Encounter: Payer: Self-pay | Admitting: Endocrinology

## 2023-10-15 DIAGNOSIS — I10 Essential (primary) hypertension: Secondary | ICD-10-CM

## 2023-10-15 LAB — HEMOGLOBIN A1C
Hgb A1c MFr Bld: 7.9 %{Hb} — ABNORMAL HIGH (ref <5.7)
Mean Plasma Glucose: 180 mg/dL
eAG (mmol/L): 10 mmol/L

## 2023-10-15 LAB — BASIC METABOLIC PANEL WITH GFR
BUN: 12 mg/dL (ref 7–25)
CO2: 29 mmol/L (ref 20–32)
Calcium: 9.2 mg/dL (ref 8.6–10.4)
Chloride: 103 mmol/L (ref 98–110)
Creat: 0.86 mg/dL (ref 0.50–1.03)
Glucose, Bld: 153 mg/dL — ABNORMAL HIGH (ref 65–99)
Potassium: 4.6 mmol/L (ref 3.5–5.3)
Sodium: 139 mmol/L (ref 135–146)

## 2023-10-17 ENCOUNTER — Encounter: Payer: Self-pay | Admitting: Endocrinology

## 2023-10-17 ENCOUNTER — Ambulatory Visit: Payer: Managed Care, Other (non HMO) | Admitting: Endocrinology

## 2023-10-17 VITALS — BP 128/80 | HR 111 | Resp 20 | Ht 63.0 in | Wt 213.4 lb

## 2023-10-17 DIAGNOSIS — E1065 Type 1 diabetes mellitus with hyperglycemia: Secondary | ICD-10-CM

## 2023-10-17 LAB — MICROALBUMIN / CREATININE URINE RATIO
Creatinine, Urine: 108 mg/dL (ref 20–275)
Microalb, Ur: <0.2 mg/dL

## 2023-10-17 MED ORDER — METFORMIN HCL ER 500 MG PO TB24: 1000.0000 mg | ORAL_TABLET | Freq: Two times a day (BID) | ORAL | 3 refills | Status: AC

## 2023-10-17 NOTE — Progress Notes (Signed)
 Outpatient Endocrinology Note [place] Alois Mincer, MD  10/17/23  Patient's Name: Tammy Vega    DOB: 10/21/1970    MRN: [id number]                                                    REASON OF VISIT: Follow up for type 1 diabetes mellitus  PCP: Patient, No Pcp Per  HISTORY OF PRESENT ILLNESS:   Tammy Vega is a 53 y.o. old female with past medical history listed below, is here for follow up for type 1 diabetes mellitus.  Patient was last seen by Dr. Lucianne Muss in August 2024.  Pertinent Diabetes History: Patient was diagnosed with type 1 diabetes mellitus in 2003.  She was initially diagnosed during pregnancy and required insulin.  She went on insulin again in 2004 and was started on insulin pump in 2005. She also tried Symlin 45 g /pramlintide in 2008-9 with some benefit in her postprandial regimen but she stopped since she could not keep up with this.  No type 1 diabetes autoimmune panel available to review.  Currently on Medtronic 780 insulin pump.  Chronic Diabetes Complications : Retinopathy: no. Last ophthalmology exam was done on annually, following with ophthalmology regularly.  Nephropathy: no, on ACE/ARB /olmesartan. Peripheral neuropathy: on Coronary artery disease: no Stroke: no  Relevant comorbidities and cardiovascular risk factors: Obesity: yes Body mass index is 37.8 kg/m.  Hypertension: Yes  Hyperlipidemia : Yes, on statin.   Current / Home Diabetic regimen includes:  Medtronic 780G with Guardian sensor using Lyumjev U100.  Insulin Pump setting:  Basal MN- 0.600u/hour 3AM- 1.55  5AM- 1.10 7:30AM-1.55 9:30AM-1.65 1PM-      0.725 4PM-      1.85 7PM-      0.650 11PM-    0.625  Bolus CHO Ratio (1unit:CHO) MN- 1:10 10AM- 1:6 3PM- 1:8 10PM- 1:10  Correction/Sensitivity: MN- 1:40 6AM- 1:30  Target: 90-140  Active insulin time: 3 hours  -Metformin extended release 2000 mg daily.  Metformin started in November 2024.  Prior diabetic  medications: Symlin in 2008/2009.  Previously had some satiety improvement with Ozempic but she does not think this is covered by insurance now.  CONTINUOUS GLUCOSE MONITORING SYSTEM (CGMS) / INSULIN PUMP INTERPRETATION:                         Medtronic Pump 7 80G & Sensor Download (Reviewed and summarized below.)  Average total daily insulin:   53 units, Bolus: 56%, auto correction 42% basal: 44%  GMI 7.3%  Smart guard mode 95%    Trends:  Frequent postprandial hyperglycemia with blood sugar up to 250-300 range with different meals mostly with lunch and dinner and sometime with breakfast.  Hypoglycemia is mostly related to late meal bolus and sometimes not enough meal bolus.  Blood sugar in between the meals and overnight are acceptable.  No hypoglycemia.  Hypoglycemia: Patient has no hypoglycemic episodes. Patient has hypoglycemia awareness.    Factors modifying glucose control: 1.  Diabetic diet assessment: 3 meals a day.  2.  Staying active or exercising: Walking.  3.  Medication compliance: compliant most of the time.  Interval history  Pump and CGM data as reviewed above.  She has frequent hyperglycemia related to meals and timing of the meal bolus.  Hemoglobin A1c  is worsening 7.9%.  GMI on CGM 7.3%.  With a start of metformin she has require less amount of insulin.  She reports occasional stomach upset taking metformin and taking 4 tablets together.    Results reviewed with stable renal function.  No other complaints today.  REVIEW OF SYSTEMS As per history of present illness.   PAST MEDICAL HISTORY: History reviewed. No pertinent past medical history.  PAST SURGICAL HISTORY: History reviewed. No pertinent surgical history.  ALLERGIES: No Known Allergies  FAMILY HISTORY:  Family History  Problem Relation Age of Onset   Hyperlipidemia Mother    Diabetes Neg Hx    Heart disease Neg Hx     SOCIAL HISTORY: Social History   Socioeconomic History   Marital  status: Married    Spouse name: Not on file   Number of children: Not on file   Years of education: Not on file   Highest education level: Not on file  Occupational History   Not on file  Tobacco Use   Smoking status: Never   Smokeless tobacco: Never  Substance and Sexual Activity   Alcohol use: Yes    Comment: occ   Drug use: Not Currently   Sexual activity: Not on file  Other Topics Concern   Not on file  Social History Narrative   Not on file   Social Drivers of Health   Financial Resource Strain: Not on file  Food Insecurity: Not on file  Transportation Needs: Not on file  Physical Activity: Not on file  Stress: Not on file  Social Connections: Not on file    MEDICATIONS:  Current Outpatient Medications  Medication Sig Dispense Refill   CONTOUR NEXT TEST test strip TEST BLOOD SUGAR 8 TIMES DAILY AS DIRECTED 300 each 3   ibuprofen (ADVIL,MOTRIN) 200 MG tablet Take 200 mg by mouth every 6 (six) hours as needed for pain (PRN).     Insulin Infusion Pump Supplies (MINIMED MIO ADVANCE INFUSE SET) MISC Change every 5 days 2 each 3   Insulin Lispro-aabc (LYUMJEV) 100 UNIT/ML SOLN USE MAX OF 70 UNITS DAILY VIA INSULIN PUMP 30 mL 3   Insulin Syringe-Needle U-100 (INSULIN SYRINGE .5CC/31GX5/16") 31G X 5/16" 0.5 ML MISC 1 each by Does not apply route daily. 10 each 0   levonorgestrel (MIRENA) 20 MCG/24HR IUD 1 each by Intrauterine route once.     olmesartan-hydrochlorothiazide (BENICAR HCT) 20-12.5 MG tablet TAKE 1 TABLET BY MOUTH EVERY DAY 90 tablet 3   rosuvastatin (CRESTOR) 40 MG tablet Take 1 tablet (40 mg total) by mouth daily. 90 tablet 3   metFORMIN (GLUCOPHAGE-XR) 500 MG 24 hr tablet Take 2 tablets (1,000 mg total) by mouth 2 (two) times daily with a meal. 360 tablet 3   No current facility-administered medications for this visit.    PHYSICAL EXAM: Vitals:   10/17/23 0842  BP: 128/80  Pulse: (!) 111  Resp: 20  SpO2: 99%  Weight: 213 lb 6.4 oz (96.8 kg)  Height:  5\' 3"  (1.6 m)   Body mass index is 37.8 kg/m.  Wt Readings from Last 3 Encounters:  10/17/23 213 lb 6.4 oz (96.8 kg)  06/24/23 216 lb 3.2 oz (98.1 kg)  03/06/23 214 lb 9.6 oz (97.3 kg)    General: Well developed, well nourished female in no apparent distress.  HEENT: AT/[place], no external lesions.  Eyes: Conjunctiva clear and no icterus. Neck: Neck supple  Lungs: Respirations not labored Neurologic: Alert, oriented, normal speech Extremities / Skin: Dry.  Psychiatric: Does not appear depressed or anxious  Diabetic Foot Exam - Simple   Simple Foot Form Visual Inspection No deformities, no ulcerations, no other skin breakdown bilaterally: Yes See comments: Yes Sensation Testing Intact to touch and monofilament testing bilaterally: Yes Pulse Check Posterior Tibialis and Dorsalis pulse intact bilaterally: Yes Comments Left great toe callus +, no ulcer.     LABS Reviewed Lab Results  Component Value Date   HGBA1C 7.9 (H) 10/15/2023   HGBA1C 7.5 (H) 06/18/2023   HGBA1C 7.1 (H) 03/06/2023   Lab Results  Component Value Date   FRUCTOSAMINE 334 (H) 02/02/2014   FRUCTOSAMINE 305 (H) 10/28/2013   FRUCTOSAMINE 315 (H) 04/23/2013   Lab Results  Component Value Date   CHOL 173 06/18/2023   HDL 57.80 06/18/2023   LDLCALC 104 (H) 06/18/2023   LDLDIRECT 104.0 07/05/2019   TRIG 58.0 06/18/2023   CHOLHDL 3 06/18/2023   Lab Results  Component Value Date   MICRALBCREAT 0.8 11/18/2022   MICRALBCREAT 0.9 10/11/2021   Lab Results  Component Value Date   CREATININE 0.86 10/15/2023   Lab Results  Component Value Date   GFR 76.68 06/18/2023    ASSESSMENT / PLAN  1. Uncontrolled type 1 diabetes mellitus with hyperglycemia (HCC)    Diabetes Mellitus type 1, complicated by no known complications. - Diabetic status / severity: Uncontrolled, worsening.  Lab Results  Component Value Date   HGBA1C 7.9 (H) 10/15/2023    - Hemoglobin A1c goal <7%   Discussed about  controlling carbohydrate portion control and diabetes diet.  Discussed about bolusing meal time insulin at least 5 minutes before eating.  Adjusted insulin pump setting as follows.  - Medications:  Insulin pump setting changed as follows:  Medtronic 780G with Guardian sensor using Lyumjev U100.  Insulin Pump setting:  Basal MN- 0.600u/hour 3AM- 1.55  5AM- 1.10 7:30AM-1.55 9:30AM-1.65 1PM-      0.725 4PM-      1.85 7PM-      0.650 11PM-    0.625  Bolus CHO Ratio (1unit:CHO) MN- 1:10, changed to 1 : 8 10AM- 1:6, changed to 1:5.5 3PM- 1:8, changed to 1 : 7 10PM- 1:10, changed to 1 : 8  Correction/Sensitivity: MN- 1:40, changed to 1:30 6AM- 1:30. Changed to 1:28  Target: 90-140  Active insulin time: 3 hours  Continue metformin extended release 500 mg advised to take 2 tablets 2 times a day with meals.  She is currently taking 4 tablets together.    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  - Home glucose testing: continue CGM and check blood glucose as needed.  - Discussed/ Gave Hypoglycemia treatment plan.  # Consult : not required at this time.   # Annual urine for microalbuminuria/ creatinine ratio, no microalbuminuria currently, continue ACE/ARB /olmesartan.  Will check today. Last  Lab Results  Component Value Date   MICRALBCREAT 0.8 11/18/2022    # Foot check nightly.  # Annual dilated diabetic eye exams.   - Diet: Make healthy diabetic food choices - Life style / activity / exercise: Discussed.  2. Blood pressure  -  BP Readings from Last 1 Encounters:  10/17/23 128/80    - Control is in target.  - No change in current plans.    3. Lipid status /  Hyperlipidemia - Last  Lab Results  Component Value Date   LDLCALC 104 (H) 06/18/2023   - Continue rosuvastatin 40 mg daily.   LDL acceptable.  Diagnoses and all orders for this visit:  Uncontrolled type 1 diabetes mellitus with hyperglycemia (HCC) -     Microalbumin / creatinine urine ratio -     metFORMIN (GLUCOPHAGE-XR) 500 MG 24 hr tablet; Take 2 tablets (1,000 mg total) by mouth 2 (two) times daily with a meal.   DISPOSITION Follow up in clinic in 3 months suggested.   All questions answered and patient verbalized understanding of the plan.  [place] Sheron Tallman, MD [facility] Endocrinology [facility] [address] [place], [address] [place], [place] [address] Phone # [phone]  At least part of this note was generated using voice recognition software. Inadvertent word errors may have occurred, which were not recognized during the proofreading process.

## 2024-01-19 ENCOUNTER — Other Ambulatory Visit: Payer: Self-pay | Admitting: Endocrinology

## 2024-01-19 ENCOUNTER — Ambulatory Visit: Admitting: Endocrinology

## 2024-01-19 ENCOUNTER — Ambulatory Visit: Payer: Self-pay | Admitting: Endocrinology

## 2024-01-19 ENCOUNTER — Encounter: Payer: Self-pay | Admitting: Endocrinology

## 2024-01-19 VITALS — BP 152/90 | HR 118 | Resp 20 | Ht 63.0 in | Wt 214.2 lb

## 2024-01-19 DIAGNOSIS — Z7984 Long term (current) use of oral hypoglycemic drugs: Secondary | ICD-10-CM | POA: Diagnosis not present

## 2024-01-19 DIAGNOSIS — E1065 Type 1 diabetes mellitus with hyperglycemia: Secondary | ICD-10-CM | POA: Diagnosis not present

## 2024-01-19 LAB — POCT GLYCOSYLATED HEMOGLOBIN (HGB A1C): Hemoglobin A1C: 7 % — AB (ref 4.0–5.6)

## 2024-01-19 NOTE — Progress Notes (Signed)
 Outpatient Endocrinology Note [place] Avari Gelles, MD  01/19/24  Patient's Name: Tammy Vega    DOB: [birth year]    MRN: [id number]                                                    REASON OF VISIT: Follow up for type 1 diabetes mellitus  PCP: Patient, No Pcp Per  HISTORY OF PRESENT ILLNESS:   Tammy Vega is a 53 y.o. old female with past medical history listed below, is here for follow up for type 1 diabetes mellitus.  Patient was last seen by Dr. Von in August 2024.  Pertinent Diabetes History: Patient was previously and was last time seen [facility], in August 2024.  Patient was diagnosed with type 1 diabetes mellitus in 2003.  She was initially diagnosed during pregnancy and required insulin .  She went on insulin  again in 2004 and was started on insulin  pump in 2005. She also tried Symlin 45 g /pramlintide in 2008-9 with some benefit in her postprandial regimen but she stopped since she could not keep up with this.  No type 1 diabetes autoimmune panel available to review.  Currently on Medtronic 780 insulin  pump.  Chronic Diabetes Complications : Retinopathy: no. Last ophthalmology exam was done on annually, following with ophthalmology regularly.  Nephropathy: no, on ACE/ARB /olmesartan . Peripheral neuropathy: on Coronary artery disease: no Stroke: no  Relevant comorbidities and cardiovascular risk factors: Obesity: yes Body mass index is 37.94 kg/m.  Hypertension: Yes  Hyperlipidemia : Yes, on statin.   Current / Home Diabetic regimen includes:  Medtronic 780G with Guardian sensor using Lyumjev  U100.  Insulin  Pump setting:  Basal MN- 0.600u/hour 3AM- 1.55  5AM- 1.10 7:30AM-1.55 9:30AM-1.65 1PM-      0.725 4PM-      1.85 7PM-      0.650 11PM-    0.625  Bolus CHO Ratio (1unit:CHO) MN- 1:8 10AM- 1:5.5 3PM- 1:7 10PM- 1:8  Correction/Sensitivity: MN- 1:30 6AM- 1:28  Target: 90-140  Active insulin  time: 3 hours  -Metformin  extended release 2000  mg daily.  Metformin  started in November 2024.  May not take all 4 tablets/day.  Prior diabetic medications: Symlin in 2008/2009.  Previously had some satiety improvement with Ozempic  but she does not think this is covered by insurance now.  CONTINUOUS GLUCOSE MONITORING SYSTEM (CGMS) / INSULIN  PUMP INTERPRETATION:                         Medtronic Pump 7 80G & Sensor Download (Reviewed and summarized below.)  Date : June 10 to January 19, 2024, 14 days  Average total daily insulin :  64 units, Bolus: 43%, auto correction 47% basal: 57%  GMI 7.0%  Smart guard mode 94%    Trends:  Frequent postprandial hyperglycemia with blood sugar up to 250 - 300 range especially with lunch and sometime with dinner.  Blood sugar in between the meals and overnight are acceptable.  No hypoglycemia.  Hyperglycemia related to not enough meal bolus and high carb meal.  Hypoglycemia: Patient has no hypoglycemic episodes. Patient has hypoglycemia awareness.    Factors modifying glucose control: 1.  Diabetic diet assessment: 3 meals a day.  2.  Staying active or exercising: Walking.  3.  Medication compliance: compliant most of the time.  Interval history  Pump and CGM data as reviewed above.  Postprandial hyperglycemia.  Hemoglobin A1c improved to 7.3%.  Patient reports in the last 1 to 2 weeks she has been busy with travel of son and daughter.  She has been taking metformin  as well.  No numbness and tingling of the feet.  No vision problem.  No other complaints today.   REVIEW OF SYSTEMS As per history of present illness.   PAST MEDICAL HISTORY: History reviewed. No pertinent past medical history.  PAST SURGICAL HISTORY: History reviewed. No pertinent surgical history.  ALLERGIES: No Known Allergies  FAMILY HISTORY:  Family History  Problem Relation Age of Onset   Hyperlipidemia Mother    Diabetes Neg Hx    Heart disease Neg Hx     SOCIAL HISTORY: Social History   Socioeconomic  History   Marital status: Married    Spouse name: Not on file   Number of children: Not on file   Years of education: Not on file   Highest education level: Not on file  Occupational History   Not on file  Tobacco Use   Smoking status: Never   Smokeless tobacco: Never  Substance and Sexual Activity   Alcohol use: Yes    Comment: occ   Drug use: Not Currently   Sexual activity: Not on file  Other Topics Concern   Not on file  Social History Narrative   Not on file   Social Drivers of Health   Financial Resource Strain: Not on file  Food Insecurity: Not on file  Transportation Needs: Not on file  Physical Activity: Not on file  Stress: Not on file  Social Connections: Not on file    MEDICATIONS:  Current Outpatient Medications  Medication Sig Dispense Refill   CONTOUR NEXT TEST test strip TEST BLOOD SUGAR 8 TIMES DAILY AS DIRECTED 300 each 3   ibuprofen (ADVIL,MOTRIN) 200 MG tablet Take 200 mg by mouth every 6 (six) hours as needed for pain (PRN).     Insulin  Infusion Pump Supplies (MINIMED MIO ADVANCE INFUSE SET) MISC Change every 5 days 2 each 3   Insulin  Lispro-aabc (LYUMJEV ) 100 UNIT/ML SOLN USE MAX OF 70 UNITS DAILY VIA INSULIN  PUMP 30 mL 3   Insulin  Syringe-Needle U-100 (INSULIN  SYRINGE .5CC/31GX5/16) 31G X 5/16 0.5 ML MISC 1 each by Does not apply route daily. 10 each 0   levonorgestrel (MIRENA) 20 MCG/24HR IUD 1 each by Intrauterine route once.     metFORMIN  (GLUCOPHAGE -XR) 500 MG 24 hr tablet Take 2 tablets (1,000 mg total) by mouth 2 (two) times daily with a meal. 360 tablet 3   olmesartan -hydrochlorothiazide (BENICAR  HCT) 20-12.5 MG tablet TAKE 1 TABLET BY MOUTH EVERY DAY 90 tablet 3   rosuvastatin  (CRESTOR ) 40 MG tablet Take 1 tablet (40 mg total) by mouth daily. 90 tablet 3   No current facility-administered medications for this visit.    PHYSICAL EXAM: Vitals:   01/19/24 0943  BP: (!) 152/90  Pulse: (!) 118  Resp: 20  SpO2: 99%  Weight: 214 lb 3.2  oz (97.2 kg)  Height: 5' 3 (1.6 m)    Body mass index is 37.94 kg/m.  Wt Readings from Last 3 Encounters:  01/19/24 214 lb 3.2 oz (97.2 kg)  10/17/23 213 lb 6.4 oz (96.8 kg)  06/24/23 216 lb 3.2 oz (98.1 kg)    General: Well developed, well nourished female in no apparent distress.  HEENT: AT/[place], no external lesions.  Eyes: Conjunctiva clear and no icterus. Neck:  Neck supple  Lungs: Respirations not labored Neurologic: Alert, oriented, normal speech Extremities / Skin: Dry.   Psychiatric: Does not appear depressed or anxious  Diabetic Foot Exam - Simple   Simple Foot Form Diabetic Foot exam was performed with the following findings: Yes 01/19/2024 10:01 AM  Visual Inspection No deformities, no ulcerations, no other skin breakdown bilaterally: Yes See comments: Yes Sensation Testing Intact to touch and monofilament testing bilaterally: Yes Pulse Check Posterior Tibialis and Dorsalis pulse intact bilaterally: Yes Comments B/l callus on great toes.      LABS Reviewed Lab Results  Component Value Date   HGBA1C 7.0 (A) 01/19/2024   HGBA1C 7.9 (H) 10/15/2023   HGBA1C 7.5 (H) 06/18/2023   Lab Results  Component Value Date   FRUCTOSAMINE 334 (H) 02/02/2014   FRUCTOSAMINE 305 (H) 10/28/2013   FRUCTOSAMINE 315 (H) 04/23/2013   Lab Results  Component Value Date   CHOL 173 06/18/2023   HDL 57.80 06/18/2023   LDLCALC 104 (H) 06/18/2023   LDLDIRECT 104.0 07/05/2019   TRIG 58.0 06/18/2023   CHOLHDL 3 06/18/2023   Lab Results  Component Value Date   MICRALBCREAT NOTE 10/17/2023   MICRALBCREAT 0.8 11/18/2022   Lab Results  Component Value Date   CREATININE 0.86 10/15/2023   Lab Results  Component Value Date   GFR 76.68 06/18/2023    ASSESSMENT / PLAN  1. Type 1 diabetes mellitus with hyperglycemia (HCC)   2. Uncontrolled type 1 diabetes mellitus with hyperglycemia (HCC)     Diabetes Mellitus type 1, complicated by no other known complications. - Diabetic  status / severity: Uncontrolled, improving.  Lab Results  Component Value Date   HGBA1C 7.0 (A) 01/19/2024    - Hemoglobin A1c goal <6.5%   Still with postprandial hyperglycemia.  She has required increased amount of basal insulin .  Changed pump setting as follows.  - Medications:  Insulin  pump setting changed as follows:  Medtronic 780G with Guardian sensor using Lyumjev  U100.  Insulin  Pump setting:  Basal Basal MN- 0.600u/hour, changed to 0.7 3AM- 1.55 , change to 1:10 5AM- 1.10 7:30AM-1.55, change to 1:1.70 9:30AM-1.65, change to 1:1.70 1PM-      0.725, changed to 1:10 4PM-      1.85, change to 1:1.90 7PM-      0.650, changed to 1:1.10 11PM-    0.625, changed to 0.70   Bolus CHO Ratio (1unit:CHO) MN- 1:8 10AM- 1:5.5 3PM- 1:7 10PM- 1:8  Correction/Sensitivity: MN- 1:30, changed to 1:25 6AM- 1:28, changed to 1:25  Target: 90-140  Active insulin  time: 3 hours   Continue metformin  extended release 500 mg 2 to 3 tablets a day as tolerated.  - Home glucose testing: continue CGM and check blood glucose as needed.  - Discussed/ Gave Hypoglycemia treatment plan.  # Consult : not required at this time.   # Annual urine for microalbuminuria/ creatinine ratio, no microalbuminuria currently, continue ACE/ARB /olmesartan .  Will check today. Last  Lab Results  Component Value Date   MICRALBCREAT NOTE 10/17/2023    # Foot check nightly.  # Annual dilated diabetic eye exams.   - Diet: Make healthy diabetic food choices - Life style / activity / exercise: Discussed.  2. Blood pressure  -  BP Readings from Last 1 Encounters:  01/19/24 (!) 152/90    - Control is in target.  - No change in current plans.    3. Lipid status / Hyperlipidemia - Last  Lab Results  Component Value Date  LDLCALC 104 (H) 06/18/2023   - Continue rosuvastatin  40 mg daily.  LDL acceptable.  Diagnoses and all orders for this visit:  Type 1 diabetes mellitus with hyperglycemia  (HCC) -     POCT glycosylated hemoglobin (Hb A1C)  Uncontrolled type 1 diabetes mellitus with hyperglycemia (HCC)    DISPOSITION Follow up in clinic in 4 months suggested.  Labs on the same day of the visit.   All questions answered and patient verbalized understanding of the plan.  [place] Lawrencia Mauney, MD [facility] Endocrinology [facility] [address] [place], [address] [place], [place] [address] Phone # [phone]  At least part of this note was generated using voice recognition software. Inadvertent word errors may have occurred, which were not recognized during the proofreading process.

## 2024-01-19 NOTE — Patient Instructions (Signed)
 Latest Reference Range & Units 06/18/23 07:42 10/15/23 08:14 01/19/24 09:45  Hemoglobin A1C 4.0 - 5.6 % 7.5 (H) 7.9 (H) 7.0 !  (H): Data is abnormally high !: Data is abnormal

## 2024-02-12 ENCOUNTER — Other Ambulatory Visit: Payer: Self-pay

## 2024-02-12 ENCOUNTER — Telehealth: Payer: Self-pay

## 2024-02-12 ENCOUNTER — Telehealth: Payer: Self-pay | Admitting: Endocrinology

## 2024-02-12 DIAGNOSIS — E1065 Type 1 diabetes mellitus with hyperglycemia: Secondary | ICD-10-CM

## 2024-02-12 NOTE — Telephone Encounter (Signed)
 Spoke with patient. Guardian sensors ordered via parachute

## 2024-02-12 NOTE — Telephone Encounter (Signed)
 Patient called to clarify what was needed in regards to her insulin  pump as well as to confirm that she is still using [employer] as her DME supplier

## 2024-02-12 NOTE — Telephone Encounter (Signed)
 Patient called this morning,stating she is a former patient of Dr.Kumar.She has an appointment with Dr.Thapa on 05/27/24,her insulin  pump is all out and needs a new RX until her appointment in October.Please advise,her contact info is [phone]

## 2024-03-21 ENCOUNTER — Other Ambulatory Visit: Payer: Self-pay | Admitting: Endocrinology

## 2024-03-21 DIAGNOSIS — E1065 Type 1 diabetes mellitus with hyperglycemia: Secondary | ICD-10-CM

## 2024-03-22 ENCOUNTER — Other Ambulatory Visit: Payer: Self-pay

## 2024-03-22 DIAGNOSIS — E1065 Type 1 diabetes mellitus with hyperglycemia: Secondary | ICD-10-CM

## 2024-03-22 MED ORDER — LYUMJEV 100 UNIT/ML IJ SOLN
INTRAMUSCULAR | 3 refills | Status: AC
Start: 2024-03-22 — End: ?

## 2024-05-27 ENCOUNTER — Ambulatory Visit: Admitting: Endocrinology

## 2024-05-27 ENCOUNTER — Encounter: Payer: Self-pay | Admitting: Endocrinology

## 2024-05-27 ENCOUNTER — Ambulatory Visit: Payer: Self-pay | Admitting: Endocrinology

## 2024-05-27 VITALS — BP 110/80 | HR 85 | Ht 63.0 in | Wt 219.0 lb

## 2024-05-27 DIAGNOSIS — E1065 Type 1 diabetes mellitus with hyperglycemia: Secondary | ICD-10-CM

## 2024-05-27 LAB — POCT GLYCOSYLATED HEMOGLOBIN (HGB A1C): Hemoglobin A1C: 7 % — AB (ref 4.0–5.6)

## 2024-05-27 NOTE — Progress Notes (Signed)
 Outpatient Endocrinology Note Nannette Zill, MD  05/27/24  Patient's Name: Tammy Vega    DOB: 1971-04-06    MRN: [id number]                                                    REASON OF VISIT: Follow up for type 1 diabetes mellitus  PCP: Patient, No Pcp Per  HISTORY OF PRESENT ILLNESS:   Tammy Vega is a 53 y.o. old female with past medical history listed below, is here for follow up for type 1 diabetes mellitus.    Pertinent Diabetes History: Patient was previously and was last time seen [facility], in August 2024.  Patient was diagnosed with type 1 diabetes mellitus in 2003.  She was initially diagnosed during pregnancy and required insulin .  She went on insulin  again in 2004 and was started on insulin  pump in 2005. She also tried Symlin 45 g /pramlintide in 2008-9 with some benefit in her postprandial regimen but she stopped since she could not keep up with this.  No type 1 diabetes autoimmune panel available to review.  Currently on Medtronic 780 insulin  pump.  Chronic Diabetes Complications : Retinopathy: no. Last ophthalmology exam was done on annually, following with ophthalmology regularly.  Nephropathy: no, on ACE/ARB /olmesartan . Peripheral neuropathy: on Coronary artery disease: no Stroke: no  Relevant comorbidities and cardiovascular risk factors: Obesity: yes Body mass index is 38.79 kg/m.  Hypertension: Yes  Hyperlipidemia : Yes, on statin.   Current / Home Diabetic regimen includes:  Medtronic 780G with Guardian 4 sensor using Lyumjev  U100.  Insulin  Pump setting:  Basal MN- 0.700u/hour 3AM- 1.10  5AM- 1.10 7:30AM-1.70 9:30AM-1.70 1PM-      1.10 4PM-      1.90 7PM-      1.10 11PM-    0.70  Bolus CHO Ratio (1unit:CHO) MN- 1:8 10AM- 1:5.5 3PM- 1:7 10PM- 1:8  Correction/Sensitivity: MN- 1:25 6AM- 1:25  Target: 90-140  Active insulin  time: 3 hours  -Metformin  extended release 2000 mg daily.  Metformin  started in November 2024.   May not take all 4 tablets/day.  Prior diabetic medications: Symlin in 2008/2009.  Previously had some satiety improvement with Ozempic  but she does not think this is covered by insurance now.  CONTINUOUS GLUCOSE MONITORING SYSTEM (CGMS) / INSULIN  PUMP INTERPRETATION:                         Medtronic Pump 7 80G & Sensor Download (Reviewed and summarized below.)  Date : October 17 to October 30 , 2025, 14 days  Average total daily insulin :  56 units, Bolus: 45%, auto correction 59% basal: 55%  GMI 7.2%  Smart guard mode 90%     Trends:  Frequent postprandial hyperglycemia mostly in the low 200 and occasionally up to 300 range related to mostly late meal bolus and high carb meal.  Blood sugar in between the meals and overnight acceptable.  No hypoglycemia.  Hypoglycemia: Patient has no hypoglycemic episodes. Patient has hypoglycemia awareness.    Factors modifying glucose control: 1.  Diabetic diet assessment: 3 meals a day.  2.  Staying active or exercising: Walking.  3.  Medication compliance: compliant most of the time.  Interval history  Pump and CGM data as reviewed above.  Hemoglobin A1c 7.0%.  Patient reports  diet has not been as great in last few weeks.  She started to walk more with her new dog lately.  No numbness and tingling of the feet.  No vision problem.  No other complaints today.   REVIEW OF SYSTEMS As per history of present illness.   PAST MEDICAL HISTORY: History reviewed. No pertinent past medical history.  PAST SURGICAL HISTORY: History reviewed. No pertinent surgical history.  ALLERGIES: No Known Allergies  FAMILY HISTORY:  Family History  Problem Relation Age of Onset   Hyperlipidemia Mother    Diabetes Neg Hx    Heart disease Neg Hx     SOCIAL HISTORY: Social History   Socioeconomic History   Marital status: Married    Spouse name: Not on file   Number of children: Not on file   Years of education: Not on file   Highest  education level: Not on file  Occupational History   Not on file  Tobacco Use   Smoking status: Never   Smokeless tobacco: Never  Substance and Sexual Activity   Alcohol use: Yes    Comment: occ   Drug use: Not Currently   Sexual activity: Not on file  Other Topics Concern   Not on file  Social History Narrative   Not on file   Social Drivers of Health   Financial Resource Strain: Not on file  Food Insecurity: Not on file  Transportation Needs: Not on file  Physical Activity: Not on file  Stress: Not on file  Social Connections: Not on file    MEDICATIONS:  Current Outpatient Medications  Medication Sig Dispense Refill   CONTOUR NEXT TEST test strip TEST BLOOD SUGAR 8 TIMES DAILY AS DIRECTED 300 each 3   ibuprofen (ADVIL,MOTRIN) 200 MG tablet Take 200 mg by mouth every 6 (six) hours as needed for pain (PRN).     Insulin  Infusion Pump Supplies (MINIMED MIO ADVANCE INFUSE SET) MISC Change every 5 days 2 each 3   Insulin  Lispro-aabc (LYUMJEV ) 100 UNIT/ML SOLN USE MAX OF 70 UNITS DAILY VIA INSULIN  PUMP 30 mL 3   Insulin  Syringe-Needle U-100 (INSULIN  SYRINGE .5CC/31GX5/16) 31G X 5/16 0.5 ML MISC 1 each by Does not apply route daily. 10 each 0   levonorgestrel (MIRENA) 20 MCG/24HR IUD 1 each by Intrauterine route once.     metFORMIN  (GLUCOPHAGE -XR) 500 MG 24 hr tablet Take 2 tablets (1,000 mg total) by mouth 2 (two) times daily with a meal. 360 tablet 3   olmesartan -hydrochlorothiazide (BENICAR  HCT) 20-12.5 MG tablet TAKE 1 TABLET BY MOUTH EVERY DAY 90 tablet 3   rosuvastatin  (CRESTOR ) 40 MG tablet Take 1 tablet (40 mg total) by mouth daily. 90 tablet 3   No current facility-administered medications for this visit.    PHYSICAL EXAM: Vitals:   05/27/24 0834  BP: 110/80  Pulse: 85  SpO2: 96%  Weight: 219 lb (99.3 kg)  Height: 5' 3 (1.6 m)    Body mass index is 38.79 kg/m.  Wt Readings from Last 3 Encounters:  05/27/24 219 lb (99.3 kg)  01/19/24 214 lb 3.2 oz (97.2  kg)  10/17/23 213 lb 6.4 oz (96.8 kg)    General: Well developed, well nourished female in no apparent distress.  HEENT: AT/[place], no external lesions.  Eyes: Conjunctiva clear and no icterus. Neck: Neck supple  Lungs: Respirations not labored Neurologic: Alert, oriented, normal speech Extremities / Skin: Dry.   Psychiatric: Does not appear depressed or anxious  Diabetic Foot Exam - Simple  No data filed    LABS Reviewed Lab Results  Component Value Date   HGBA1C 7.0 (A) 05/27/2024   HGBA1C 7.0 (A) 01/19/2024   HGBA1C 7.9 (H) 10/15/2023   Lab Results  Component Value Date   FRUCTOSAMINE 334 (H) 02/02/2014   FRUCTOSAMINE 305 (H) 10/28/2013   FRUCTOSAMINE 315 (H) 04/23/2013   Lab Results  Component Value Date   CHOL 173 06/18/2023   HDL 57.80 06/18/2023   LDLCALC 104 (H) 06/18/2023   LDLDIRECT 104.0 07/05/2019   TRIG 58.0 06/18/2023   CHOLHDL 3 06/18/2023   Lab Results  Component Value Date   MICRALBCREAT NOTE 10/17/2023   Lab Results  Component Value Date   CREATININE 0.86 10/15/2023   Lab Results  Component Value Date   GFR 76.68 06/18/2023    ASSESSMENT / PLAN  1. Uncontrolled type 1 diabetes mellitus with hyperglycemia (HCC)     Diabetes Mellitus type 1, complicated by no other known complications. - Diabetic status / severity: Uncontrolled Lab Results  Component Value Date   HGBA1C 7.0 (A) 05/27/2024    - Hemoglobin A1c goal <6.5%   Postprandial hyperglycemia, advised to limit carbohydrate in the meal and bolus for the meal 5 minutes before eating or at least at the start of eating.  - Medications:  Insulin  pump setting changed as follows: No change in settings today.  Medtronic 780G with Guardian sensor using Lyumjev  U100.  Continue metformin  extended release 500 mg 2 to 3 tablets a day as tolerated.  - Home glucose testing: continue CGM and check blood glucose as needed.  - Discussed/ Gave Hypoglycemia treatment plan.  # Consult : not  required at this time.   # Annual urine for microalbuminuria/ creatinine ratio, no microalbuminuria currently, continue ACE/ARB /olmesartan .  Last  Lab Results  Component Value Date   MICRALBCREAT NOTE 10/17/2023    # Foot check nightly.  # Annual dilated diabetic eye exams.   - Diet: Make healthy diabetic food choices - Life style / activity / exercise: Discussed.  2. Blood pressure  -  BP Readings from Last 1 Encounters:  05/27/24 110/80    - Control is in target.  - No change in current plans.    3. Lipid status / Hyperlipidemia - Last  Lab Results  Component Value Date   LDLCALC 104 (H) 06/18/2023   - Continue rosuvastatin  40 mg daily.  LDL acceptable.  Diagnoses and all orders for this visit:  Uncontrolled type 1 diabetes mellitus with hyperglycemia (HCC) -     POCT glycosylated hemoglobin (Hb A1C)    DISPOSITION Follow up in clinic in 4 months suggested.  Labs on the same day of the visit.   All questions answered and patient verbalized understanding of the plan.  Darlyne Schmiesing, MD [facility] Endocrinology [facility] [address] [place], [address] [place], [place] [address] Phone # [phone]  At least part of this note was generated using voice recognition software. Inadvertent word errors may have occurred, which were not recognized during the proofreading process.

## 2024-07-17 ENCOUNTER — Other Ambulatory Visit: Payer: Self-pay | Admitting: Endocrinology

## 2024-07-17 DIAGNOSIS — E7800 Pure hypercholesterolemia, unspecified: Secondary | ICD-10-CM

## 2024-08-10 ENCOUNTER — Telehealth: Payer: Self-pay | Admitting: Endocrinology

## 2024-08-10 NOTE — Telephone Encounter (Signed)
 Patient called this morning stating her settings on Medtronic adg pump reset and is needing help setting it up again.Patient requested a message back due to not being able to take phone calls.

## 2024-09-29 ENCOUNTER — Ambulatory Visit: Admitting: Endocrinology
